# Patient Record
Sex: Female | Born: 1975
Health system: Southern US, Community
[De-identification: ages and names within clinical notes are randomized; demographics above are authoritative.]

## PROBLEM LIST (undated history)

## (undated) DIAGNOSIS — I10 Essential (primary) hypertension: Secondary | ICD-10-CM

## (undated) DIAGNOSIS — G43909 Migraine, unspecified, not intractable, without status migrainosus: Secondary | ICD-10-CM

## (undated) HISTORY — PX: TUBAL LIGATION: SHX77

---

## 2017-01-17 DIAGNOSIS — G43111 Migraine with aura, intractable, with status migrainosus: Secondary | ICD-10-CM | POA: Insufficient documentation

## 2017-08-07 DIAGNOSIS — R42 Dizziness and giddiness: Secondary | ICD-10-CM | POA: Insufficient documentation

## 2019-06-07 ENCOUNTER — Encounter (HOSPITAL_COMMUNITY): Payer: Self-pay | Admitting: Emergency Medicine

## 2019-06-07 ENCOUNTER — Inpatient Hospital Stay (HOSPITAL_COMMUNITY)
Admission: EM | Admit: 2019-06-07 | Discharge: 2019-06-11 | DRG: 690 | Disposition: A | Discharge status: Home / Self Care | Payer: Self-pay | Attending: Internal Medicine | Admitting: Internal Medicine

## 2019-06-07 DIAGNOSIS — Z20828 Contact with and (suspected) exposure to other viral communicable diseases: Secondary | ICD-10-CM

## 2019-06-07 DIAGNOSIS — Z8619 Personal history of other infectious and parasitic diseases: Secondary | ICD-10-CM

## 2019-06-07 DIAGNOSIS — Z8349 Family history of other endocrine, nutritional and metabolic diseases: Secondary | ICD-10-CM

## 2019-06-07 DIAGNOSIS — B690 Cysticercosis of central nervous system: Secondary | ICD-10-CM

## 2019-06-07 DIAGNOSIS — Z6841 Body Mass Index (BMI) 40.0 and over, adult: Secondary | ICD-10-CM

## 2019-06-07 DIAGNOSIS — N10 Acute tubulo-interstitial nephritis: Principal | ICD-10-CM | POA: Diagnosis present

## 2019-06-07 DIAGNOSIS — Z8249 Family history of ischemic heart disease and other diseases of the circulatory system: Secondary | ICD-10-CM

## 2019-06-07 DIAGNOSIS — N12 Tubulo-interstitial nephritis, not specified as acute or chronic: Secondary | ICD-10-CM | POA: Diagnosis present

## 2019-06-07 DIAGNOSIS — R7881 Bacteremia: Secondary | ICD-10-CM | POA: Diagnosis present

## 2019-06-07 DIAGNOSIS — B9620 Unspecified Escherichia coli [E. coli] as the cause of diseases classified elsewhere: Secondary | ICD-10-CM | POA: Diagnosis present

## 2019-06-07 DIAGNOSIS — N390 Urinary tract infection, site not specified: Secondary | ICD-10-CM

## 2019-06-07 DIAGNOSIS — D509 Iron deficiency anemia, unspecified: Secondary | ICD-10-CM

## 2019-06-07 DIAGNOSIS — R7989 Other specified abnormal findings of blood chemistry: Secondary | ICD-10-CM | POA: Diagnosis present

## 2019-06-07 DIAGNOSIS — Z833 Family history of diabetes mellitus: Secondary | ICD-10-CM

## 2019-06-07 DIAGNOSIS — I10 Essential (primary) hypertension: Secondary | ICD-10-CM | POA: Diagnosis present

## 2019-06-07 DIAGNOSIS — Z9851 Tubal ligation status: Secondary | ICD-10-CM

## 2019-06-07 DIAGNOSIS — R1031 Right lower quadrant pain: Secondary | ICD-10-CM

## 2019-06-07 DIAGNOSIS — R112 Nausea with vomiting, unspecified: Secondary | ICD-10-CM | POA: Diagnosis present

## 2019-06-07 DIAGNOSIS — R519 Headache, unspecified: Secondary | ICD-10-CM

## 2019-06-07 DIAGNOSIS — G43909 Migraine, unspecified, not intractable, without status migrainosus: Secondary | ICD-10-CM | POA: Diagnosis present

## 2019-06-07 HISTORY — DX: Migraine, unspecified, not intractable, without status migrainosus: G43.909

## 2019-06-07 HISTORY — DX: Essential (primary) hypertension: I10

## 2019-06-07 LAB — I-STAT BETA HCG BLOOD, ED (MC, WL, AP ONLY): I-stat hCG, quantitative: 32.3 m[IU]/mL — ABNORMAL HIGH (ref <5)

## 2019-06-07 LAB — URINALYSIS, ROUTINE W REFLEX MICROSCOPIC
Bacteria, UA: NONE SEEN
Bilirubin Urine: NEGATIVE
Glucose, UA: NEGATIVE mg/dL
Ketones, ur: NEGATIVE mg/dL
Nitrite: NEGATIVE
Protein, ur: 100 mg/dL — AB
Specific Gravity, Urine: 1.023 (ref 1.005–1.030)
WBC, UA: >50 WBC/hpf — ABNORMAL HIGH (ref 0–5)
pH: 5 (ref 5.0–8.0)

## 2019-06-07 LAB — COMPREHENSIVE METABOLIC PANEL
ALT: 104 U/L — ABNORMAL HIGH (ref 0–44)
AST: 85 U/L — ABNORMAL HIGH (ref 15–41)
Albumin: 3.9 g/dL (ref 3.5–5.0)
Alkaline Phosphatase: 129 U/L — ABNORMAL HIGH (ref 38–126)
Anion gap: 15 (ref 5–15)
BUN: 7 mg/dL (ref 6–20)
CO2: 17 mmol/L — ABNORMAL LOW (ref 22–32)
Calcium: 9.1 mg/dL (ref 8.9–10.3)
Chloride: 106 mmol/L (ref 98–111)
Creatinine, Ser: 1.19 mg/dL — ABNORMAL HIGH (ref 0.44–1.00)
GFR calc Af Amer: >60 mL/min (ref >60)
GFR calc non Af Amer: 56 mL/min — ABNORMAL LOW (ref >60)
Glucose, Bld: 132 mg/dL — ABNORMAL HIGH (ref 70–99)
Potassium: 3.7 mmol/L (ref 3.5–5.1)
Sodium: 138 mmol/L (ref 135–145)
Total Bilirubin: 1.5 mg/dL — ABNORMAL HIGH (ref 0.3–1.2)
Total Protein: 7.5 g/dL (ref 6.5–8.1)

## 2019-06-07 LAB — CBC
HCT: 38.7 % (ref 36.0–46.0)
Hemoglobin: 12.3 g/dL (ref 12.0–15.0)
MCH: 24.4 pg — ABNORMAL LOW (ref 26.0–34.0)
MCHC: 31.8 g/dL (ref 30.0–36.0)
MCV: 76.6 fL — ABNORMAL LOW (ref 80.0–100.0)
Platelets: 212 10*3/uL (ref 150–400)
RBC: 5.05 MIL/uL (ref 3.87–5.11)
RDW: 17.4 % — ABNORMAL HIGH (ref 11.5–15.5)
WBC: 24.8 10*3/uL — ABNORMAL HIGH (ref 4.0–10.5)
nRBC: 0 % (ref 0.0–0.2)

## 2019-06-07 LAB — LACTIC ACID, PLASMA: Lactic Acid, Venous: 1.6 mmol/L (ref 0.5–1.9)

## 2019-06-07 LAB — LIPASE, BLOOD: Lipase: 24 U/L (ref 11–51)

## 2019-06-07 MED ORDER — HYDROMORPHONE HCL 1 MG/ML IJ SOLN
1.0000 mg | Freq: Once | INTRAMUSCULAR | Status: AC
  Administered 2019-06-07: 1 mg via INTRAVENOUS
  Filled 2019-06-07: qty 1

## 2019-06-07 MED ORDER — KETOROLAC TROMETHAMINE 30 MG/ML IJ SOLN
30.0000 mg | Freq: Once | INTRAMUSCULAR | Status: AC
  Administered 2019-06-07: 23:00:00 30 mg via INTRAVENOUS
  Filled 2019-06-07: qty 1

## 2019-06-07 MED ORDER — SODIUM CHLORIDE 0.9% FLUSH: 3.0000 mL | Freq: Once | INTRAVENOUS | Status: DC

## 2019-06-07 MED ORDER — SODIUM CHLORIDE 0.9 % IV BOLUS
30.0000 mL/kg | Freq: Once | INTRAVENOUS | Status: AC
  Administered 2019-06-07: 23:00:00 3675 mL via INTRAVENOUS

## 2019-06-07 MED ORDER — SODIUM CHLORIDE 0.9 % IV SOLN
1.0000 g | Freq: Once | INTRAVENOUS | Status: AC
  Administered 2019-06-07: 1 g via INTRAVENOUS
  Filled 2019-06-07: qty 10

## 2019-06-07 MED ORDER — ACETAMINOPHEN 500 MG PO TABS
1000.0000 mg | ORAL_TABLET | Freq: Once | ORAL | Status: AC
  Administered 2019-06-07: 23:00:00 1000 mg via ORAL
  Filled 2019-06-07: qty 2

## 2019-06-07 NOTE — ED Triage Notes (Signed)
Patient reports migraine headache and low abdominal pain radiating to lower back onset this week , denies fever or chills / no emesis or diarrhea .

## 2019-06-07 NOTE — ED Provider Notes (Signed)
[facility] EMERGENCY [facility]PART[facility] Provider Note   CSN: [id number] Arrival date & time: 06/07/19  2025    History   Chief Complaint Chief Complaint  Patient presents with  . Migraine  . Abdominal Pain    HPI Connie Rush is a 43 y.o. female.     HPI Patient reports he started getting some lower abdominal pain about 2 days ago.  Ports that aching a lot in her lower abdomen and around her right side into her right back.  Reports she has been having some pain and burning with urination.  Ports she has had a kidney infection about 5 years ago.  She denies abnormal vaginal bleeding or discharge.  She reports she has had her tubes tied.  Ports she has had a fever up to 101 today.  She reports that today she is also developed a generalized headache and has vomited about 5 times. Past Medical History:  Diagnosis Date  . Hypertension     There are no active problems to display for this patient.   History reviewed. No pertinent surgical history.   OB History   No obstetric history on file.      Home Medications    Prior to Admission medications   Not on File    Family History No family history on file.  Social History Social History   Tobacco Use  . Smoking status: Never Smoker  . Smokeless tobacco: Never Used  Substance Use Topics  . Alcohol use: Never    Frequency: Never  . Drug use: Never     Allergies   Patient has no known allergies.   Review of Systems Review of Systems 10 Systems reviewed and are negative for acute change except as noted in the HPI.   Physical Exam Updated Vital Signs BP (!) 133/100   Pulse 92   Temp 99.1 F (37.3 C) (Oral)   Resp 18   Wt 122.5 kg   SpO2 100%   Physical Exam Constitutional:      Comments: Patient is alert.  No respiratory distress.  She appears uncomfortable and ill.  Morbid obesity.  HENT:     Mouth/Throat:     Mouth: Mucous membranes are moist.     Pharynx: Oropharynx is clear.   Eyes:     Extraocular Movements: Extraocular movements intact.     Conjunctiva/sclera: Conjunctivae normal.  Neck:     Musculoskeletal: Neck supple.  Cardiovascular:     Rate and Rhythm: Normal rate and regular rhythm.  Pulmonary:     Effort: Pulmonary effort is normal.     Breath sounds: Normal breath sounds.  Abdominal:     Comments: Abdomen tender in the right lower quadrant and right flank to percussion.  No guarding.  Musculoskeletal: Normal range of motion.        General: No swelling or tenderness.     Right lower leg: No edema.     Left lower leg: No edema.  Skin:    General: Skin is warm and dry.  Neurological:     General: No focal deficit present.     Mental Status: She is oriented to person, place, and time.     Coordination: Coordination normal.      ED Treatments / Results  Labs (all labs ordered are listed, but only abnormal results are displayed) Labs Reviewed  COMPREHENSIVE METABOLIC PANEL - Abnormal; Notable for the following components:      Result Value   CO2 17 (*)  Glucose, Bld 132 (*)    Creatinine, Ser 1.19 (*)    AST 85 (*)    ALT 104 (*)    Alkaline Phosphatase 129 (*)    Total Bilirubin 1.5 (*)    GFR calc non Af Amer 56 (*)    All other components within normal limits  CBC - Abnormal; Notable for the following components:   WBC 24.8 (*)    MCV 76.6 (*)    MCH 24.4 (*)    RDW 17.4 (*)    All other components within normal limits  URINALYSIS, ROUTINE W REFLEX MICROSCOPIC - Abnormal; Notable for the following components:   Color, Urine AMBER (*)    APPearance CLOUDY (*)    Hgb urine dipstick MODERATE (*)    Protein, ur 100 (*)    Leukocytes,Ua LARGE (*)    WBC, UA >50 (*)    Non Squamous Epithelial 0-5 (*)    All other components within normal limits  I-STAT BETA HCG BLOOD, ED (MC, [facility], AP ONLY) - Abnormal; Notable for the following components:   I-stat hCG, quantitative 32.3 (*)    All other components within normal limits   CULTURE, BLOOD (ROUTINE X 2)  CULTURE, BLOOD (ROUTINE X 2)  URINE CULTURE  WET PREP, GENITAL  LIPASE, BLOOD  LACTIC ACID, PLASMA  LACTIC ACID, PLASMA  RPR  HIV ANTIBODY (ROUTINE TESTING W REFLEX)  GC/CHLAMYDIA PROBE AMP () NOT AT [facility]    EKG None  Radiology No results found.  Procedures Procedures (including critical care time)  Medications Ordered in ED Medications  sodium chloride flush (NS) 0.9 % injection 3 mL (has no administration in time range)  sodium chloride 0.9 % bolus 3,675 mL (has no administration in time range)  cefTRIAXone (ROCEPHIN) 1 g in sodium chloride 0.9 % 100 mL IVPB (has no administration in time range)  ketorolac (TORADOL) 30 MG/ML injection 30 mg (has no administration in time range)  HYDROmorphone (DILAUDID) injection 1 mg (has no administration in time range)  acetaminophen (TYLENOL) tablet 1,000 mg (has no administration in time range)     Initial Impression / Assessment and Plan / ED Course  I have reviewed the triage vital signs and the nursing notes.  Pertinent labs & imaging results that were available during my care of the patient were reviewed by me and considered in my medical decision making (see chart for details).        Patient has had approximately 2 days of lower abdominal pain with flank pain, nausea vomiting fever and dysuria.  Urine is grossly positive.  Will start Rocephin and fluids as well as Toradol and Dilaudid for pain.  Patient will need CT abdomen to rule out possible retained stone or appendicitis.  Patient's quant is very mildly positive.  Significantly doubt pregnancy patient has tubal ligation.  Will need to proceed with CT scan, to be determined if pelvic ultrasound needed as well.  Final Clinical Impressions(s) / ED Diagnoses   Final diagnoses:  Right lower quadrant abdominal pain  Lower urinary tract infectious disease    ED Discharge Orders    None       Charlesetta Shanks, MD 06/14/19 1300

## 2019-06-08 ENCOUNTER — Emergency Department (HOSPITAL_COMMUNITY): Payer: Self-pay

## 2019-06-08 ENCOUNTER — Encounter (HOSPITAL_COMMUNITY): Payer: Self-pay | Admitting: Internal Medicine

## 2019-06-08 DIAGNOSIS — N12 Tubulo-interstitial nephritis, not specified as acute or chronic: Secondary | ICD-10-CM | POA: Diagnosis present

## 2019-06-08 DIAGNOSIS — N390 Urinary tract infection, site not specified: Secondary | ICD-10-CM

## 2019-06-08 DIAGNOSIS — R799 Abnormal finding of blood chemistry, unspecified: Secondary | ICD-10-CM

## 2019-06-08 DIAGNOSIS — R1031 Right lower quadrant pain: Secondary | ICD-10-CM

## 2019-06-08 LAB — CBC
HCT: 32.9 % — ABNORMAL LOW (ref 36.0–46.0)
Hemoglobin: 10.5 g/dL — ABNORMAL LOW (ref 12.0–15.0)
MCH: 24.6 pg — ABNORMAL LOW (ref 26.0–34.0)
MCHC: 31.9 g/dL (ref 30.0–36.0)
MCV: 77.2 fL — ABNORMAL LOW (ref 80.0–100.0)
Platelets: 159 10*3/uL (ref 150–400)
RBC: 4.26 MIL/uL (ref 3.87–5.11)
RDW: 17.7 % — ABNORMAL HIGH (ref 11.5–15.5)
WBC: 24.4 10*3/uL — ABNORMAL HIGH (ref 4.0–10.5)
nRBC: 0 % (ref 0.0–0.2)

## 2019-06-08 LAB — BLOOD CULTURE ID PANEL (REFLEXED)

## 2019-06-08 LAB — HEPATIC FUNCTION PANEL
ALT: 78 U/L — ABNORMAL HIGH (ref 0–44)
AST: 56 U/L — ABNORMAL HIGH (ref 15–41)
Albumin: 3.1 g/dL — ABNORMAL LOW (ref 3.5–5.0)
Alkaline Phosphatase: 107 U/L (ref 38–126)
Bilirubin, Direct: 0.4 mg/dL — ABNORMAL HIGH (ref 0.0–0.2)
Indirect Bilirubin: 0.2 mg/dL — ABNORMAL LOW (ref 0.3–0.9)
Total Bilirubin: 0.6 mg/dL (ref 0.3–1.2)
Total Protein: 6.3 g/dL — ABNORMAL LOW (ref 6.5–8.1)

## 2019-06-08 LAB — BASIC METABOLIC PANEL
Anion gap: 10 (ref 5–15)
BUN: 6 mg/dL (ref 6–20)
CO2: 19 mmol/L — ABNORMAL LOW (ref 22–32)
Calcium: 8.3 mg/dL — ABNORMAL LOW (ref 8.9–10.3)
Chloride: 109 mmol/L (ref 98–111)
Creatinine, Ser: 1.05 mg/dL — ABNORMAL HIGH (ref 0.44–1.00)
GFR calc Af Amer: >60 mL/min (ref >60)
GFR calc non Af Amer: >60 mL/min (ref >60)
Glucose, Bld: 122 mg/dL — ABNORMAL HIGH (ref 70–99)
Potassium: 3.6 mmol/L (ref 3.5–5.1)
Sodium: 138 mmol/L (ref 135–145)

## 2019-06-08 LAB — HIV ANTIBODY (ROUTINE TESTING W REFLEX): HIV Screen 4th Generation wRfx: NONREACTIVE

## 2019-06-08 LAB — NOVEL CORONAVIRUS, NAA (HOSP ORDER, SEND-OUT TO REF LAB; TAT 18-24 HRS): SARS-CoV-2, NAA: NOT DETECTED

## 2019-06-08 LAB — LACTIC ACID, PLASMA: Lactic Acid, Venous: 1 mmol/L (ref 0.5–1.9)

## 2019-06-08 LAB — POC URINE PREG, ED: Preg Test, Ur: NEGATIVE

## 2019-06-08 LAB — HCG, QUANTITATIVE, PREGNANCY: hCG, Beta Chain, Quant, S: <1 m[IU]/mL (ref <5)

## 2019-06-08 LAB — RPR: RPR Ser Ql: NONREACTIVE

## 2019-06-08 MED ORDER — PROMETHAZINE HCL 12.5 MG RE SUPP
12.5000 mg | Freq: Four times a day (QID) | RECTAL | Status: DC | PRN
  Filled 2019-06-08: qty 2

## 2019-06-08 MED ORDER — ACETAMINOPHEN 650 MG RE SUPP: 650.0000 mg | Freq: Four times a day (QID) | RECTAL | Status: DC | PRN

## 2019-06-08 MED ORDER — ONDANSETRON HCL 4 MG PO TABS: 4.0000 mg | ORAL_TABLET | Freq: Four times a day (QID) | ORAL | Status: DC | PRN

## 2019-06-08 MED ORDER — IOHEXOL 300 MG/ML  SOLN
100.0000 mL | Freq: Once | INTRAMUSCULAR | Status: AC | PRN
  Administered 2019-06-08: 100 mL via INTRAVENOUS

## 2019-06-08 MED ORDER — ENOXAPARIN SODIUM 40 MG/0.4ML ~~LOC~~ SOLN
40.0000 mg | SUBCUTANEOUS | Status: DC
  Administered 2019-06-08: 40 mg via SUBCUTANEOUS
  Filled 2019-06-08 (×2): qty 0.4

## 2019-06-08 MED ORDER — SODIUM CHLORIDE 0.9 % IV SOLN
1.0000 g | Freq: Three times a day (TID) | INTRAVENOUS | Status: DC
  Administered 2019-06-08 – 2019-06-09 (×5): 1 g via INTRAVENOUS
  Filled 2019-06-08 (×8): qty 1

## 2019-06-08 MED ORDER — SODIUM CHLORIDE 0.9 % IV SOLN
2.0000 g | INTRAVENOUS | Status: DC
  Filled 2019-06-08: qty 20

## 2019-06-08 MED ORDER — ONDANSETRON HCL 4 MG/2ML IJ SOLN
4.0000 mg | Freq: Once | INTRAMUSCULAR | Status: AC
  Administered 2019-06-08: 4 mg via INTRAVENOUS
  Filled 2019-06-08: qty 2

## 2019-06-08 MED ORDER — SODIUM CHLORIDE 0.9 % IV SOLN
1.0000 g | INTRAVENOUS | Status: DC
  Filled 2019-06-08: qty 10

## 2019-06-08 MED ORDER — KETOROLAC TROMETHAMINE 30 MG/ML IJ SOLN
30.0000 mg | Freq: Four times a day (QID) | INTRAMUSCULAR | Status: DC | PRN
  Administered 2019-06-08 – 2019-06-10 (×7): 30 mg via INTRAVENOUS
  Filled 2019-06-08 (×7): qty 1

## 2019-06-08 MED ORDER — ONDANSETRON HCL 4 MG/2ML IJ SOLN
4.0000 mg | Freq: Four times a day (QID) | INTRAMUSCULAR | Status: DC | PRN
  Administered 2019-06-08 (×2): 4 mg via INTRAVENOUS
  Filled 2019-06-08 (×2): qty 2

## 2019-06-08 MED ORDER — HYDROMORPHONE HCL 1 MG/ML IJ SOLN
0.5000 mg | INTRAMUSCULAR | Status: DC | PRN
  Administered 2019-06-08 – 2019-06-11 (×15): 1 mg via INTRAVENOUS
  Filled 2019-06-08 (×15): qty 1

## 2019-06-08 MED ORDER — SODIUM CHLORIDE 0.9 % IV SOLN: INTRAVENOUS | Status: DC

## 2019-06-08 MED ORDER — SODIUM CHLORIDE 0.9 % IV SOLN
INTRAVENOUS | Status: DC
  Administered 2019-06-08 – 2019-06-10 (×4): via INTRAVENOUS

## 2019-06-08 MED ORDER — PROMETHAZINE HCL 25 MG/ML IJ SOLN
12.5000 mg | Freq: Four times a day (QID) | INTRAMUSCULAR | Status: DC | PRN
  Administered 2019-06-08: 25 mg via INTRAVENOUS
  Filled 2019-06-08: qty 1

## 2019-06-08 MED ORDER — PROMETHAZINE HCL 25 MG PO TABS: 12.5000 mg | ORAL_TABLET | Freq: Four times a day (QID) | ORAL | Status: DC | PRN

## 2019-06-08 MED ORDER — ACETAMINOPHEN 325 MG PO TABS: 650.0000 mg | ORAL_TABLET | Freq: Four times a day (QID) | ORAL | Status: DC | PRN

## 2019-06-08 NOTE — ED Notes (Signed)
Dr. Garner in to assess pt for admission 

## 2019-06-08 NOTE — ED Provider Notes (Signed)
Patient signed out to me by Dr. Vallery Ridge to follow-up CT scan.  Patient seen for several days of lower abdominal pain, radiating into the back.  Patient has a significant leukocytosis and obvious urinary tract infection on initial work-up.  CT scan has now been performed and shows evidence of right pyelonephritis, no other evidence of intra-abdominal or pelvic pathology.  Patient actively vomiting upon recheck.  Will admit for further management.   Orpah Greek, MD 06/08/19 [phone]

## 2019-06-08 NOTE — Progress Notes (Signed)
Patients daughter Monia Pouch called and wanted to make sure that we were using an interpreter for the patient. I notified Wyvonnia Lora, RN.

## 2019-06-08 NOTE — Progress Notes (Signed)
43 yo F presenting with NV. CT ab/pelvis c/w R pyelonephritis. UCx pending. On rocephin. Continue as per H&P. COVID testing pending. Low suspicion for COVID.  Pyelonephritis     - Rocephin     - UCx and BCx pending     - zofran PRN, Phenergan PRN refractory N/V     - toradol PRN, Dilaudid PRN refractory pain  Mildly Elevated serum b-HCG - 32.3     - Urine pregnancy test neg however     - History of tubal ligation     - No evidence of ectopic or other abnormality that could cause this detected on CT scan tonight     - Will get a repeat serum B-HCG with AM labs this morning and see if its real (vs a false positive on initial serum test) and trending anywhere.  Elevated LFTs     - repeat LFTs; check hepatitis panel if it remains elevated  General: 43 y.o. female resting in bed in NAD Cardiovascular: RRR, +S1, S2, no m/g/r, equal pulses throughout Respiratory: CTABL, no w/r/r, normal WOB GI: BS+, NDNT, no masses noted, no organomegaly noted MSK: No e/c/c, R CVAT Skin: No rashes, bruises, ulcerations noted Neuro: A&O x 3, no focal deficits  .Taji Sather A Marylyn Ishihara DO

## 2019-06-08 NOTE — Progress Notes (Signed)
RN did not know pt was not swabbed for Covid. Order was placed after calling MD and [facility] came to do swab. Pt was placed on precautions and results were walked down to the lab. Awaiting results.   Eleanora Neighbor, RN

## 2019-06-08 NOTE — Progress Notes (Signed)
Attempted to call spouse, Rey to update on pt condition, no answer, mailbox had not been set up.

## 2019-06-08 NOTE — Progress Notes (Signed)
Patient's daughter came by the hospital to drop items off for her mom. She got an update on how her mom was doing. She wanted to talk to the MD. MD was notified. [place] K Natassia Guthridge

## 2019-06-08 NOTE — H&P (Signed)
History and Physical    Connie Rush [id number] DOB: April 11, 1976 DOA: 06/07/2019  PCP: Patient, No Pcp Per  Patient coming from: Home  I have personally briefly reviewed patient's old [facility]  Chief Complaint: N/V, abd pain, headache  HPI: Connie HuaYulma Mainer is a 43 y.o. female with medical history significant of HTN, migraines.  Patient presents to the ED with worsening RLQ abd pain, N/V, headache.  Symptoms onset 2 days ago.  Pain radiates around into her lower back.  Symptoms worsening.  Feels like a kidney infection she had about 5 years ago.  Tm 101 at home today.   ED Course: UA looks infected, and CT abd/pelvis confirms R sided pyelonephritis.  b-HCG is 32.3 on the blood test oddly enough, though urine pregnancy test is negative, and she has a history of tubal ligation.  No other abnormality other than the pyelonephritis noted on CT.  Rocephin started, N/V persistent.  Hospitalist asked to admit.   Review of Systems: As per HPI otherwise 10 point review of systems negative.   Past Medical History:  Diagnosis Date   Hypertension    Migraine     Past Surgical History:  Procedure Laterality Date   TUBAL LIGATION       reports that she has never smoked. She has never used smokeless tobacco. She reports that she does not drink alcohol or use drugs.  No Known Allergies  Family History  Problem Relation Age of Onset   Diabetes Father    Hyperlipidemia Father    Hypertension Father    CAD Maternal Grandmother    Stroke Neg Hx    Breast cancer Neg Hx      Prior to Admission medications   Not on File    Physical Exam: Vitals:   06/07/19 2315 06/07/19 2330 06/08/19 0015 06/08/19 0030  BP: (!) 141/96 (!) 148/94 127/72 128/76  Pulse: 77 88 83 85  Resp:      Temp:      TempSrc:      SpO2: 96% 95% 95% 98%  Weight:        Constitutional: NAD, calm, comfortable Eyes: PERRL, lids and conjunctivae normal ENMT: Mucous membranes  are moist. Posterior pharynx clear of any exudate or lesions.Normal dentition.  Neck: normal, supple, no masses, no thyromegaly Respiratory: clear to auscultation bilaterally, no wheezing, no crackles. Normal respiratory effort. No accessory muscle use.  Cardiovascular: Regular rate and rhythm, no murmurs / rubs / gallops. No extremity edema. 2+ pedal pulses. No carotid bruits.  Abdomen: R flank TTP Musculoskeletal: no clubbing / cyanosis. No joint deformity upper and lower extremities. Good ROM, no contractures. Normal muscle tone.  Skin: no rashes, lesions, ulcers. No induration Neurologic: CN 2-12 grossly intact. Sensation intact, DTR normal. Strength 5/5 in all 4.  Psychiatric: Normal judgment and insight. Alert and oriented x 3. Normal mood.    Labs on Admission: I have personally reviewed following labs and imaging studies  CBC: Recent Labs  Lab 06/07/19 2044  WBC 24.8*  HGB 12.3  HCT 38.7  MCV 76.6*  PLT 212   Basic Metabolic Panel: Recent Labs  Lab 06/07/19 2044  NA 138  K 3.7  CL 106  CO2 17*  GLUCOSE 132*  BUN 7  CREATININE 1.19*  CALCIUM 9.1   GFR: CrCl cannot be calculated (Unknown ideal weight.). Liver Function Tests: Recent Labs  Lab 06/07/19 2044  AST 85*  ALT 104*  ALKPHOS 129*  BILITOT 1.5*  PROT  7.5  ALBUMIN 3.9   Recent Labs  Lab 06/07/19 2044  LIPASE 24   No results for input(s): AMMONIA in the last 168 hours. Coagulation Profile: No results for input(s): INR, PROTIME in the last 168 hours. Cardiac Enzymes: No results for input(s): CKTOTAL, CKMB, CKMBINDEX, TROPONINI in the last 168 hours. BNP (last 3 results) No results for input(s): PROBNP in the last 8760 hours. HbA1C: No results for input(s): HGBA1C in the last 72 hours. CBG: No results for input(s): GLUCAP in the last 168 hours. Lipid Profile: No results for input(s): CHOL, HDL, LDLCALC, TRIG, CHOLHDL, LDLDIRECT in the last 72 hours. Thyroid Function Tests: No results for  input(s): TSH, T4TOTAL, FREET4, T3FREE, THYROIDAB in the last 72 hours. Anemia Panel: No results for input(s): VITAMINB12, FOLATE, FERRITIN, TIBC, IRON, RETICCTPCT in the last 72 hours. Urine analysis:    Component Value Date/Time   COLORURINE AMBER (A) 06/07/2019 2120   APPEARANCEUR CLOUDY (A) 06/07/2019 2120   LABSPEC 1.023 06/07/2019 2120   PHURINE 5.0 06/07/2019 2120   GLUCOSEU NEGATIVE 06/07/2019 2120   HGBUR MODERATE (A) 06/07/2019 2120   BILIRUBINUR NEGATIVE 06/07/2019 2120   KETONESUR NEGATIVE 06/07/2019 2120   PROTEINUR 100 (A) 06/07/2019 2120   NITRITE NEGATIVE 06/07/2019 2120   LEUKOCYTESUR LARGE (A) 06/07/2019 2120    Radiological Exams on Admission: Ct Abdomen Pelvis W Contrast  Result Date: 06/08/2019 CLINICAL DATA:  Abdominal pain. EXAM: CT ABDOMEN AND PELVIS WITH CONTRAST TECHNIQUE: Multidetector CT imaging of the abdomen and pelvis was performed using the standard protocol following bolus administration of intravenous contrast. CONTRAST:  100mL OMNIPAQUE IOHEXOL 300 MG/ML  SOLN COMPARISON:  None. FINDINGS: Lower chest: No acute abnormality. Hepatobiliary: No focal liver abnormality is seen. No gallstones, gallbladder wall thickening, or biliary dilatation. Pancreas: Unremarkable. No pancreatic ductal dilatation or surrounding inflammatory changes. Spleen: Normal in size without focal abnormality. Adrenals/Urinary Tract: There is fat stranding about the right kidney with mild right-sided collecting system dilatation. There is diffuse enhancement of the right ureter. There is no evidence of a radiopaque obstructing kidney stone. The left kidney is unremarkable with no evidence of a radiopaque obstructing stone. The bladder is unremarkable. Stomach/Bowel: There are scattered colonic diverticula without CT evidence of diverticulitis. The appendix is unremarkable. The stomach is unremarkable. There is no evidence of a small-bowel obstruction. Vascular/Lymphatic: Aortic  atherosclerosis. No enlarged abdominal or pelvic lymph nodes. Reproductive: There is a small amount of fluid in the lower uterine segment. There is an right ovary cystic structure measuring approximately 3.2 cm. Other: No abdominal wall hernia or abnormality. No abdominopelvic ascites. Musculoskeletal: No acute or significant osseous findings. IMPRESSION: Overall findings consistent with right-sided pyelonephritis. Electronically Signed   By: Katherine Mantlehristopher  Green M.D.   On: 06/08/2019 01:24    EKG: Independently reviewed.  Assessment/Plan Principal Problem:   Pyelonephritis    1. Pyelonephritis - 1. Rocephin 2. UCx and BCx pending 3. IVF: got 5730ml/kg (3.6L) bolus in ED, will hold off on further fluids for the moment as she isnt tachycardic, lactate nl.  Start maint fluids later today if she has persistent N/V 4. zofran PRN n/v 5. Phenergan PRN refractory N/V 6. toradol PRN pain 7. Dilaudid PRN refractory pain 2. Mildly Elevated serum b-HCG - 32.3 1. Urine pregnancy test neg however 2. History of tubal ligation 3. No evidence of ectopic or other abnormality that could cause this detected on CT scan tonight 4. Will get a repeat serum B-HCG with AM labs this morning and see if  its real (vs a false positive on initial serum test) and trending anywhere.  DVT prophylaxis: Lovenox Code Status: Full Family Communication: No family in room Disposition Plan: Home after admit Consults called: None Admission status: Place in 25   Delmus Warwick, [place] [facility]  How to contact the [facility] Attending or Consulting provider [place] or covering provider during after hours [place], for this patient?  1. Check the care team in [facility] and look for a) attending/consulting [facility] provider listed and b) the [facility] team listed 2. Log into [email or web address]  Amion Physician Scheduling and messaging for groups and whole hospitals  On call and physician scheduling software for group practices, residents,  hospitalists and other medical providers for call, clinic, rotation and shift schedules. OnCall [employer] is a hospital-wide system for scheduling doctors and paging doctors on call. EasyPlot is for scientific plotting and data analysis.  [email or web address]  and use [place]'s universal password to access. If you do not have the password, please contact the hospital operator.  3. Locate the [facility] provider you are looking for under [facility] and page to a number that you can be directly reached. 4. If you still have difficulty reaching the provider, please page the [facility] (Director on Call) for the Hospitalists listed on amion for assistance.  06/08/2019, 2:53 AM

## 2019-06-08 NOTE — Progress Notes (Signed)
Pt was on the phone with her husband when she told this RN that she has a brain cyst that was diagnosed 2 years ago and she wonders if that is why her eyes are hurting with her migraines. Pt stated MD is in the process of retrieving her medical records, but she forgot to mention this to MD. This RN messaged NP on call to make them aware. Pt stated that she was seeing someone for it, but the medication to shrink it is so expensive that she was waiting to get on some assistance program the MD recommended in order to get the medication. Pt has not had any of the medicine yet for it, per pt.   Eleanora Neighbor, RN

## 2019-06-08 NOTE — Progress Notes (Signed)
PHARMACY - PHYSICIAN COMMUNICATION CRITICAL VALUE ALERT - BLOOD CULTURE IDENTIFICATION (BCID)  Connie Rush is an 43 y.o. female who presented to [facility] on [facility]/07/2019 with a chief complaint of RLQ abd pain  Assessment: 18 YOF on antibiotics for R-sided pyelonephritis now with 4/4 blood cultures growing GNR with BCID detecting E. Coli. The patient does have a noted history of an ESBL E.coli UTI back in 2016. Will broaden to Meropenem until sensitivities are back and then can consider narrowing at that time.   Name of physician (or Provider) Contacted: Marylyn Ishihara  Current antibiotics: Rocephin  Changes to prescribed antibiotics recommended:  D/c Rocephin and transition to Meropenem 1g IV every 8 hours while awaiting sensitivities given history of prior ESBL  Results for orders placed or performed during the hospital encounter of 06/07/19  Blood Culture ID Panel (Reflexed) (Collected: 06/07/2019 10:45 PM)  Result Value Ref Range   Enterococcus species NOT DETECTED NOT DETECTED   Listeria monocytogenes NOT DETECTED NOT DETECTED   Staphylococcus species NOT DETECTED NOT DETECTED   Staphylococcus aureus (BCID) NOT DETECTED NOT DETECTED   Streptococcus species NOT DETECTED NOT DETECTED   Streptococcus agalactiae NOT DETECTED NOT DETECTED   Streptococcus pneumoniae NOT DETECTED NOT DETECTED   Streptococcus pyogenes NOT DETECTED NOT DETECTED   Acinetobacter baumannii NOT DETECTED NOT DETECTED   Enterobacteriaceae species DETECTED (A) NOT DETECTED   Enterobacter cloacae complex NOT DETECTED NOT DETECTED   Escherichia coli DETECTED (A) NOT DETECTED   Klebsiella oxytoca NOT DETECTED NOT DETECTED   Klebsiella pneumoniae NOT DETECTED NOT DETECTED   Proteus species NOT DETECTED NOT DETECTED   Serratia marcescens NOT DETECTED NOT DETECTED   Carbapenem resistance NOT DETECTED NOT DETECTED   Haemophilus influenzae NOT DETECTED NOT DETECTED   Neisseria meningitidis NOT DETECTED NOT DETECTED   Pseudomonas aeruginosa NOT DETECTED NOT DETECTED   Candida albicans NOT DETECTED NOT DETECTED   Candida glabrata NOT DETECTED NOT DETECTED   Candida krusei NOT DETECTED NOT DETECTED   Candida parapsilosis NOT DETECTED NOT DETECTED   Candida tropicalis NOT DETECTED NOT DETECTED    Thank you for allowing pharmacy to be a part of this patient's care.  Alycia Rossetti, PharmD, BCPS Clinical Pharmacist Clinical phone for 06/08/2019: [phone] 06/08/2019 12:12 PM   **Pharmacist phone directory can now be found on [email or web address] (PW [email or web address]).  Listed under [place].

## 2019-06-09 ENCOUNTER — Encounter (HOSPITAL_COMMUNITY): Payer: Self-pay | Admitting: *Deleted

## 2019-06-09 ENCOUNTER — Other Ambulatory Visit: Payer: Self-pay

## 2019-06-09 DIAGNOSIS — B690 Cysticercosis of central nervous system: Secondary | ICD-10-CM

## 2019-06-09 DIAGNOSIS — R519 Headache, unspecified: Secondary | ICD-10-CM

## 2019-06-09 DIAGNOSIS — D509 Iron deficiency anemia, unspecified: Secondary | ICD-10-CM

## 2019-06-09 LAB — CBC WITH DIFFERENTIAL/PLATELET
Abs Immature Granulocytes: 0.12 10*3/uL — ABNORMAL HIGH (ref 0.00–0.07)
Basophils Absolute: 0.1 10*3/uL (ref 0.0–0.1)
Basophils Relative: 0 %
Eosinophils Absolute: 0.1 10*3/uL (ref 0.0–0.5)
Eosinophils Relative: 1 %
HCT: 30.3 % — ABNORMAL LOW (ref 36.0–46.0)
Hemoglobin: 9.5 g/dL — ABNORMAL LOW (ref 12.0–15.0)
Immature Granulocytes: 1 %
Lymphocytes Relative: 10 %
Lymphs Abs: 2 10*3/uL (ref 0.7–4.0)
MCH: 24.3 pg — ABNORMAL LOW (ref 26.0–34.0)
MCHC: 31.4 g/dL (ref 30.0–36.0)
MCV: 77.5 fL — ABNORMAL LOW (ref 80.0–100.0)
Monocytes Absolute: 2.1 10*3/uL — ABNORMAL HIGH (ref 0.1–1.0)
Monocytes Relative: 11 %
Neutro Abs: 14.7 10*3/uL — ABNORMAL HIGH (ref 1.7–7.7)
Neutrophils Relative %: 77 %
Platelets: 140 10*3/uL — ABNORMAL LOW (ref 150–400)
RBC: 3.91 MIL/uL (ref 3.87–5.11)
RDW: 18.1 % — ABNORMAL HIGH (ref 11.5–15.5)
WBC: 19 10*3/uL — ABNORMAL HIGH (ref 4.0–10.5)
nRBC: 0 % (ref 0.0–0.2)

## 2019-06-09 LAB — COMPREHENSIVE METABOLIC PANEL
ALT: 50 U/L — ABNORMAL HIGH (ref 0–44)
AST: 28 U/L (ref 15–41)
Albumin: 3.1 g/dL — ABNORMAL LOW (ref 3.5–5.0)
Alkaline Phosphatase: 96 U/L (ref 38–126)
Anion gap: 7 (ref 5–15)
BUN: 7 mg/dL (ref 6–20)
CO2: 22 mmol/L (ref 22–32)
Calcium: 8.7 mg/dL — ABNORMAL LOW (ref 8.9–10.3)
Chloride: 107 mmol/L (ref 98–111)
Creatinine, Ser: 1.09 mg/dL — ABNORMAL HIGH (ref 0.44–1.00)
GFR calc Af Amer: >60 mL/min (ref >60)
GFR calc non Af Amer: >60 mL/min (ref >60)
Glucose, Bld: 101 mg/dL — ABNORMAL HIGH (ref 70–99)
Potassium: 3.5 mmol/L (ref 3.5–5.1)
Sodium: 136 mmol/L (ref 135–145)
Total Bilirubin: 0.5 mg/dL (ref 0.3–1.2)
Total Protein: 6.4 g/dL — ABNORMAL LOW (ref 6.5–8.1)

## 2019-06-09 LAB — URINE CULTURE: Culture: <10000 — AB

## 2019-06-09 LAB — IRON AND TIBC
Iron: 8 ug/dL — ABNORMAL LOW (ref 28–170)
Saturation Ratios: 2 % — ABNORMAL LOW (ref 10.4–31.8)
TIBC: 462 ug/dL — ABNORMAL HIGH (ref 250–450)
UIBC: 454 ug/dL

## 2019-06-09 LAB — MAGNESIUM: Magnesium: 1.8 mg/dL (ref 1.7–2.4)

## 2019-06-09 MED ORDER — DIPHENHYDRAMINE HCL 50 MG/ML IJ SOLN
25.0000 mg | Freq: Once | INTRAMUSCULAR | Status: AC
  Administered 2019-06-09: 25 mg via INTRAVENOUS
  Filled 2019-06-09: qty 1

## 2019-06-09 MED ORDER — METOCLOPRAMIDE HCL 5 MG/ML IJ SOLN
5.0000 mg | Freq: Once | INTRAMUSCULAR | Status: AC
  Administered 2019-06-09: 5 mg via INTRAVENOUS
  Filled 2019-06-09: qty 2

## 2019-06-09 MED ORDER — KETOROLAC TROMETHAMINE 15 MG/ML IJ SOLN
15.0000 mg | Freq: Once | INTRAMUSCULAR | Status: AC
  Administered 2019-06-09: 15 mg via INTRAVENOUS
  Filled 2019-06-09: qty 1

## 2019-06-09 NOTE — Progress Notes (Signed)
PROGRESS NOTE    Connie Rush  HYQ:[id number] DOB: 1976-08-31 DOA: 06/07/2019 PCP: Patient, No Pcp Per   Brief Narrative:   Connie Rush is a 43 y.o. female with medical history significant of HTN, migraines.  Patient presents to the ED with worsening RLQ abd pain, N/V, headache.  Symptoms onset 2 days ago.  Pain radiates around into her lower back.  Symptoms worsening.  Feels like a kidney infection she had about 5 years ago.  Tm 101 at home today.   Assessment & Plan:   Principal Problem:   Pyelonephritis   Pyelonephritis     - Rocephin     - UCx and BCx pending     - zofran PRN, Phenergan PRN refractory N/V     - toradol PRN, Dilaudid PRN refractory pain     - rocephin changed to merrem d/t history of ESBL     - white count improved, a febrile ON  Mildly Elevated serum b-HCG - 32.3     - Urine pregnancy test neg however     - History of tubal ligation     - No evidence of ectopic or other abnormality that could cause this detected on CT scan tonight     - Will get a repeat serum B-HCG with AM labs this morning and see if its real (vs a false positive on initial serum test) and trending anywhere.  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 merrem    Subjective: "I have a headache. I have cyst on brain."  Objective: Vitals:   06/08/19 1812 06/08/19 2034 06/09/19 0642 06/09/19 0945  BP: (!) 146/86 (!) 154/89 130/77 (!) 151/93  Pulse: 78 82 88 64  Resp: 18 20 20 18   Temp: 99.5 F (37.5 C) 98 F (36.7 C) 98.7 F (37.1 C) 99 F (37.2 C)  TempSrc: Oral Oral Oral Oral  SpO2: 97% 97% 92%  93%  Weight:   127.9 kg   Height:        Intake/Output Summary (Last 24 hours) at 06/09/2019 1219 Last data filed at 06/09/2019 1044 Gross per 24 hour  Intake 1585.15 ml  Output 800 ml  Net 785.15 ml   Filed Weights   06/07/19 2200 06/07/19 2205 06/09/19 0642  Weight: 122.5 kg 122.5 kg 127.9 kg    Examination:  General: 43 y.o. female resting in bed in NAD Cardiovascular: RRR, +S1, S2, no m/g/r, equal pulses throughout Respiratory: CTABL, no w/r/r, normal WOB GI: BS+, NDNT, no masses noted, no organomegaly noted MSK: No e/c/c, R CVAT Skin: No rashes, bruises, ulcerations noted Neuro: A&O x 3, no focal deficits    Data Reviewed: I have personally reviewed following labs and imaging studies.  CBC: Recent Labs  Lab 06/07/19 2044 06/08/19 0630 06/09/19 0921  WBC 24.8* 24.4* 19.0*  NEUTROABS  --   --  14.7*  HGB 12.3 10.5* 9.5*  HCT 38.7 32.9* 30.3*  MCV 76.6* 77.2* 77.5*  PLT 212 159 140*   Basic Metabolic Panel:  Recent Labs  Lab 06/07/19 2044 06/08/19 0630 06/09/19 0921  NA 138 138 136  K 3.7 3.6 3.5  CL 106 109 107  CO2 17* 19* 22  GLUCOSE 132* 122* 101*  BUN 7 6 7   CREATININE 1.19* 1.05* 1.09*  CALCIUM 9.1 8.3* 8.7*  MG  --   --  1.8   GFR: Estimated Creatinine Clearance: 92.6 mL/min (A) (by C-G formula based on SCr of 1.09 mg/dL (H)). Liver Function Tests: Recent Labs  Lab 06/07/19 2044 06/08/19 0644 06/09/19 0921  AST 85* 56* 28  ALT 104* 78* 50*  ALKPHOS 129* 107 96  BILITOT 1.5* 0.6 0.5  PROT 7.5 6.3* 6.4*  ALBUMIN 3.9 3.1* 3.1*   Recent Labs  Lab 06/07/19 2044  LIPASE 24   No results for input(s): AMMONIA in the last 168 hours. Coagulation Profile: No results for input(s): INR, PROTIME in the last 168 hours. Cardiac Enzymes: No results for input(s): CKTOTAL, CKMB, CKMBINDEX, TROPONINI in the last 168 hours. BNP (last 3 results) No results for input(s): PROBNP in the last 8760 hours. HbA1C: No results for input(s): HGBA1C in the  last 72 hours. CBG: No results for input(s): GLUCAP in the last 168 hours. Lipid Profile: No results for input(s): CHOL, HDL, LDLCALC, TRIG, CHOLHDL, LDLDIRECT in the last 72 hours. Thyroid Function Tests: No results for input(s): TSH, T4TOTAL, FREET4, T3FREE, THYROIDAB in the last 72 hours. Anemia Panel: No results for input(s): VITAMINB12, FOLATE, FERRITIN, TIBC, IRON, RETICCTPCT in the last 72 hours. Sepsis Labs: Recent Labs  Lab 06/07/19 2238 06/08/19 0630  LATICACIDVEN 1.6 1.0    Recent Results (from the past 240 hour(s))  Culture, blood (routine x 2)     Status: Abnormal (Preliminary result)   Collection Time: 06/07/19 10:30 PM   Specimen: BLOOD RIGHT HAND  Result Value Ref Range Status   Specimen Description BLOOD RIGHT HAND  Final   Special Requests   Final    BOTTLES DRAWN AEROBIC AND ANAEROBIC Blood Culture adequate volume   Culture  Setup Time   Final    GRAM NEGATIVE RODS IN BOTH AEROBIC AND ANAEROBIC BOTTLES CRITICAL RESULT CALLED TO, READ BACK BY AND VERIFIED WITH: E. MARTIN, PHARMD AT 1140 ON 06/08/19 BY C. JESSUP, MLT. Performed at [facility], [address] [place], [place] [address]    Culture ESCHERICHIA COLI (A)  Final   Report Status PENDING  Incomplete  Culture, blood (routine x 2)     Status: Abnormal (Preliminary result)   Collection Time: 06/07/19 10:45 PM   Specimen: BLOOD LEFT HAND  Result Value Ref Range Status   Specimen Description BLOOD LEFT HAND  Final   Special Requests   Final    BOTTLES DRAWN AEROBIC AND ANAEROBIC Blood Culture adequate volume   Culture  Setup Time   Final    GRAM NEGATIVE RODS IN BOTH AEROBIC AND ANAEROBIC BOTTLES CRITICAL RESULT CALLED TO, READ BACK BY AND VERIFIED WITH: E. MARTIN, PHARMD AT 1140 ON 06/08/19 BY C. JESSUP, MLT. Performed at [facility], [address] [place], [place] [address]    Culture ESCHERICHIA COLI (A)  Final   Report Status PENDING  Incomplete  Blood Culture ID Panel (Reflexed)      Status: Abnormal   Collection Time: 06/07/19 10:45 PM  Result Value Ref Range Status   Enterococcus species NOT DETECTED NOT DETECTED Final   Listeria monocytogenes NOT DETECTED NOT DETECTED Final   Staphylococcus species NOT DETECTED NOT DETECTED  Final   Staphylococcus aureus (BCID) NOT DETECTED NOT DETECTED Final   Streptococcus species NOT DETECTED NOT DETECTED Final   Streptococcus agalactiae NOT DETECTED NOT DETECTED Final   Streptococcus pneumoniae NOT DETECTED NOT DETECTED Final   Streptococcus pyogenes NOT DETECTED NOT DETECTED Final   Acinetobacter baumannii NOT DETECTED NOT DETECTED Final   Enterobacteriaceae species DETECTED (A) NOT DETECTED Final    Comment: Enterobacteriaceae represent a large family of gram-negative bacteria, not a single organism. CRITICAL RESULT CALLED TO, READ BACK BY AND VERIFIED WITH: E. MARTIN, PHARMD AT 1140 ON 06/08/19 BY C. JESSUP, MLT.    Enterobacter cloacae complex NOT DETECTED NOT DETECTED Final   Escherichia coli DETECTED (A) NOT DETECTED Final    Comment: CRITICAL RESULT CALLED TO, READ BACK BY AND VERIFIED WITH: E. MARTIN, PHARMD AT 1140 ON 06/08/19 BY C. JESSUP, MLT.    Klebsiella oxytoca NOT DETECTED NOT DETECTED Final   Klebsiella pneumoniae NOT DETECTED NOT DETECTED Final   Proteus species NOT DETECTED NOT DETECTED Final   Serratia marcescens NOT DETECTED NOT DETECTED Final   Carbapenem resistance NOT DETECTED NOT DETECTED Final   Haemophilus influenzae NOT DETECTED NOT DETECTED Final   Neisseria meningitidis NOT DETECTED NOT DETECTED Final   Pseudomonas aeruginosa NOT DETECTED NOT DETECTED Final   Candida albicans NOT DETECTED NOT DETECTED Final   Candida glabrata NOT DETECTED NOT DETECTED Final   Candida krusei NOT DETECTED NOT DETECTED Final   Candida parapsilosis NOT DETECTED NOT DETECTED Final   Candida tropicalis NOT DETECTED NOT DETECTED Final    Comment: Performed at [facility], [place] [address], [place],  [place] [address]  Urine culture     Status: Abnormal   Collection Time: 06/08/19 12:06 AM   Specimen: Urine, Random  Result Value Ref Range Status   Specimen Description URINE, RANDOM  Final   Special Requests NONE  Final   Culture (A)  Final    <10,000 COLONIES/mL INSIGNIFICANT GROWTH Performed at [facility], [place] [address] [place], [place] [address]    Report Status 06/09/2019 FINAL  Final  Novel Coronavirus,NAA,(SEND-OUT TO REF LAB - TAT 24-48 hrs); Hosp Order     Status: None   Collection Time: 06/08/19  5:51 AM   Specimen: Nasopharyngeal Swab; Respiratory  Result Value Ref Range Status   SARS-CoV-2, NAA NOT DETECTED NOT DETECTED Final    Comment: (NOTE) This test was developed and its performance characteristics determined by [employer]. This test has not been FDA cleared or approved. This test has been authorized by FDA under an Emergency Use Authorization (EUA). This test is only authorized for the duration of time the declaration that circumstances exist justifying the authorization of the emergency use of in vitro diagnostic tests for detection of SARS-CoV-2 virus and/or diagnosis of COVID-19 infection under section 564(b)(1) of the Act, 21 U.S.C. 631SHF-0(Y)(6), unless the authorization is terminated or revoked sooner. When diagnostic testing is negative, the possibility of a false negative result should be considered in the context of a patient's recent exposures and the presence of clinical signs and symptoms consistent with COVID-19. An individual without symptoms of COVID-19 and who is not shedding SARS-CoV-2 virus would expect to have a negative (not detected) result in this assay. Performed  At: [facility] [address] [place], [place] [id number] Rush Farmer MD DX:[id number]    [place]  Final    Comment: Performed at [facility], [place] [address] [place], [place] [address]  Radiology Studies: Ct  Abdomen Pelvis W Contrast  Result Date: 06/08/2019 CLINICAL DATA:  Abdominal pain. EXAM: CT ABDOMEN AND PELVIS WITH CONTRAST TECHNIQUE: Multidetector CT imaging of the abdomen and pelvis was performed using the standard protocol following bolus administration of intravenous contrast. CONTRAST:  100mL OMNIPAQUE IOHEXOL 300 MG/ML  SOLN COMPARISON:  None. FINDINGS: Lower chest: No acute abnormality. Hepatobiliary: No focal liver abnormality is seen. No gallstones, gallbladder wall thickening, or biliary dilatation. Pancreas: Unremarkable. No pancreatic ductal dilatation or surrounding inflammatory changes. Spleen: Normal in size without focal abnormality. Adrenals/Urinary Tract: There is fat stranding about the right kidney with mild right-sided collecting system dilatation. There is diffuse enhancement of the right ureter. There is no evidence of a radiopaque obstructing kidney stone. The left kidney is unremarkable with no evidence of a radiopaque obstructing stone. The bladder is unremarkable. Stomach/Bowel: There are scattered colonic diverticula without CT evidence of diverticulitis. The appendix is unremarkable. The stomach is unremarkable. There is no evidence of a small-bowel obstruction. Vascular/Lymphatic: Aortic atherosclerosis. No enlarged abdominal or pelvic lymph nodes. Reproductive: There is a small amount of fluid in the lower uterine segment. There is an right ovary cystic structure measuring approximately 3.2 cm. Other: No abdominal wall hernia or abnormality. No abdominopelvic ascites. Musculoskeletal: No acute or significant osseous findings. IMPRESSION: Overall findings consistent with right-sided pyelonephritis. Electronically Signed   By: Katherine Mantlehristopher  Green M.D.   On: 06/08/2019 01:24    Scheduled Meds: . enoxaparin (LOVENOX) injection  40 mg Subcutaneous Q24H  . sodium chloride flush  3 mL Intravenous Once   Continuous Infusions: . sodium chloride 75 mL/hr at 06/09/19 0359  .  meropenem (MERREM) IV 1 g (06/09/19 0614)     LOS: 0 days    Time spent: 25 minutes spent in the coordination of care today.    Teddy Spikeyrone A Reade Trefz, DO [facility] Pager [phone]  If 7PM-7AM, please contact night-coverage [email or web address] Password [email or web address] 06/09/2019, 12:19 PM

## 2019-06-10 ENCOUNTER — Inpatient Hospital Stay (HOSPITAL_COMMUNITY): Payer: Self-pay

## 2019-06-10 LAB — CBC WITH DIFFERENTIAL/PLATELET
Abs Immature Granulocytes: 0.11 10*3/uL — ABNORMAL HIGH (ref 0.00–0.07)
Basophils Absolute: 0.1 10*3/uL (ref 0.0–0.1)
Basophils Relative: 0 %
Eosinophils Absolute: 0.5 10*3/uL (ref 0.0–0.5)
Eosinophils Relative: 4 %
HCT: 29 % — ABNORMAL LOW (ref 36.0–46.0)
Hemoglobin: 9 g/dL — ABNORMAL LOW (ref 12.0–15.0)
Immature Granulocytes: 1 %
Lymphocytes Relative: 20 %
Lymphs Abs: 2.4 10*3/uL (ref 0.7–4.0)
MCH: 24.2 pg — ABNORMAL LOW (ref 26.0–34.0)
MCHC: 31 g/dL (ref 30.0–36.0)
MCV: 78 fL — ABNORMAL LOW (ref 80.0–100.0)
Monocytes Absolute: 1.5 10*3/uL — ABNORMAL HIGH (ref 0.1–1.0)
Monocytes Relative: 12 %
Neutro Abs: 7.4 10*3/uL (ref 1.7–7.7)
Neutrophils Relative %: 63 %
Platelets: 137 10*3/uL — ABNORMAL LOW (ref 150–400)
RBC: 3.72 MIL/uL — ABNORMAL LOW (ref 3.87–5.11)
RDW: 18 % — ABNORMAL HIGH (ref 11.5–15.5)
WBC: 11.9 10*3/uL — ABNORMAL HIGH (ref 4.0–10.5)
nRBC: 0 % (ref 0.0–0.2)

## 2019-06-10 LAB — RENAL FUNCTION PANEL
Albumin: 2.9 g/dL — ABNORMAL LOW (ref 3.5–5.0)
Anion gap: 6 (ref 5–15)
BUN: 5 mg/dL — ABNORMAL LOW (ref 6–20)
CO2: 23 mmol/L (ref 22–32)
Calcium: 8.5 mg/dL — ABNORMAL LOW (ref 8.9–10.3)
Chloride: 107 mmol/L (ref 98–111)
Creatinine, Ser: 1.02 mg/dL — ABNORMAL HIGH (ref 0.44–1.00)
GFR calc Af Amer: >60 mL/min (ref >60)
GFR calc non Af Amer: >60 mL/min (ref >60)
Glucose, Bld: 91 mg/dL (ref 70–99)
Phosphorus: 2.8 mg/dL (ref 2.5–4.6)
Potassium: 4.9 mmol/L (ref 3.5–5.1)
Sodium: 136 mmol/L (ref 135–145)

## 2019-06-10 LAB — CULTURE, BLOOD (ROUTINE X 2)
Special Requests: ADEQUATE
Special Requests: ADEQUATE

## 2019-06-10 LAB — MAGNESIUM: Magnesium: 2.1 mg/dL (ref 1.7–2.4)

## 2019-06-10 MED ORDER — LIDOCAINE VISCOUS HCL 2 % MT SOLN
15.0000 mL | Freq: Once | OROMUCOSAL | Status: AC
  Administered 2019-06-10: 15 mL via ORAL
  Filled 2019-06-10: qty 15

## 2019-06-10 MED ORDER — METOCLOPRAMIDE HCL 5 MG/ML IJ SOLN
10.0000 mg | Freq: Once | INTRAMUSCULAR | Status: AC
  Administered 2019-06-10: 10 mg via INTRAVENOUS
  Filled 2019-06-10: qty 2

## 2019-06-10 MED ORDER — GADOBUTROL 1 MMOL/ML IV SOLN
10.0000 mL | Freq: Once | INTRAVENOUS | Status: AC | PRN
  Administered 2019-06-10: 10 mL via INTRAVENOUS

## 2019-06-10 MED ORDER — HYDRALAZINE HCL 20 MG/ML IJ SOLN
10.0000 mg | Freq: Once | INTRAMUSCULAR | Status: AC
  Administered 2019-06-10: 10 mg via INTRAVENOUS
  Filled 2019-06-10: qty 1

## 2019-06-10 MED ORDER — AMOXICILLIN-POT CLAVULANATE 875-125 MG PO TABS
1.0000 | ORAL_TABLET | Freq: Two times a day (BID) | ORAL | Status: DC
  Administered 2019-06-11: 1 via ORAL
  Filled 2019-06-10: qty 1

## 2019-06-10 MED ORDER — ALUM & MAG HYDROXIDE-SIMETH 200-200-20 MG/5ML PO SUSP
30.0000 mL | Freq: Once | ORAL | Status: AC
  Administered 2019-06-10: 30 mL via ORAL
  Filled 2019-06-10: qty 30

## 2019-06-10 MED ORDER — DIPHENHYDRAMINE HCL 50 MG/ML IJ SOLN
25.0000 mg | Freq: Once | INTRAMUSCULAR | Status: AC
  Administered 2019-06-10: 25 mg via INTRAVENOUS
  Filled 2019-06-10: qty 1

## 2019-06-10 MED ORDER — KETOROLAC TROMETHAMINE 30 MG/ML IJ SOLN
30.0000 mg | Freq: Once | INTRAMUSCULAR | Status: AC
  Administered 2019-06-10: 30 mg via INTRAVENOUS
  Filled 2019-06-10: qty 1

## 2019-06-10 MED ORDER — CEFAZOLIN SODIUM-DEXTROSE 2-4 GM/100ML-% IV SOLN
2.0000 g | Freq: Three times a day (TID) | INTRAVENOUS | Status: AC
  Administered 2019-06-10 (×3): 2 g via INTRAVENOUS
  Filled 2019-06-10 (×3): qty 100

## 2019-06-10 MED ORDER — LISINOPRIL 20 MG PO TABS
20.0000 mg | ORAL_TABLET | Freq: Once | ORAL | Status: AC
  Administered 2019-06-10: 20 mg via ORAL
  Filled 2019-06-10: qty 1

## 2019-06-10 MED ORDER — LISINOPRIL 10 MG PO TABS: 10.0000 mg | ORAL_TABLET | Freq: Every day | ORAL | Status: DC

## 2019-06-10 NOTE — Progress Notes (Signed)
   06/10/19 2153  Vitals  BP (!) 179/106  MAP (mmHg) 128  BP Method Automatic  Pulse Rate 64  On call provider made aware. RN will continue to monitor.

## 2019-06-10 NOTE — Plan of Care (Signed)
  Problem: Pain Managment: Goal: General experience of comfort will improve Outcome: Progressing   

## 2019-06-10 NOTE — Progress Notes (Signed)
   06/10/19 0318  Pain Assessment  Pain Scale 0-10  Pain Score 10  Pain Type Acute pain  Pain Location Head (Eyes)  Pain Orientation Right;Left  Pain Descriptors / Indicators Aching;Throbbing  Pain Frequency Intermittent  Pain Onset On-going  Patients Stated Pain Goal 0  Pain Intervention(s) MD notified (Comment) (awaiting orders )

## 2019-06-10 NOTE — Plan of Care (Signed)
  Problem: Education: Goal: Knowledge of General Education information will improve Description Including pain rating scale, medication(s)/side effects and non-pharmacologic comfort measures Outcome: Progressing   

## 2019-06-10 NOTE — Progress Notes (Addendum)
Connie Rush.  PROGRESS NOTE    Connie Rush  OZH:[id number] DOB: [birth year] DOA: 06/07/2019 PCP: Patient, No Pcp Per   Brief Narrative:   Connie Castrois a 43 y.o.femalewith medical history significant ofHTN, migraines. Patient presents to the ED with worsening RLQ abd pain, N/V, headache. Symptoms onset 2 days ago. Pain radiates around into her lower back. Symptoms worsening. Feels like a kidney infection she had about 5 years ago. Tm 101 at home today.   Assessment & Plan:   Principal Problem:   Pyelonephritis Active Problems:   Neurocysticercosis   Headache   Microcytic anemia   Pyelonephritis/ESBL bacteremia - Rocephin - UCx and BCx pending - zofran PRN, Phenergan PRN refractory N/V - toradol PRN, Dilaudid PRN refractory pain     - rocephin changed to merrem d/t history of ESBL     - white count improved, a febrile ON     - merrem downgraded to cefazolin     - can likely change to augmentin in the AM; 875mg  BID to complete 14 abx regimen (End Date 06/20/2019)  Mildly Elevated serum b-HCG - 32.3 - Urine pregnancy test neg however - History of tubal ligation - No evidence of ectopic or other abnormality that could cause this detected on CT scan tonight - Will get a repeat serum B-HCG with AM labs this morning and see if its real (vs a false positive on initial serum test) and trending anywhere.  Elevated LFTs - repeat LFTs; check hepatitis panel if it remains elevated     - improved  HTN     - resume lisinopril  Headache     - reports a history of "brain cyst"; [facility]     - chart review shows history of neurocysticercosis [facility]; unable to reach them at this time     - MRI Brain results noted; neuro notified, no changes to care at this time, she can follow up with her regular neurologist     - history of chronic migraine; unable to afford migraine treatment  Microcytic anemia     - slow  trend down on Hgb. No frank bleed     - Fe deficient; currently w/ bacteremia, replete after completion of abx regimen  DVT prophylaxis: Lovenox Code Status: FULL   Disposition Plan: TBD   Antimicrobials:   cefazolin    Subjective: "I feel a little better."  Objective: Vitals:   06/09/19 2238 06/10/19 0050 06/10/19 0417 06/10/19 0937  BP: 136/85  138/83 (!) 151/92  Pulse: 73  68 67  Resp: (!) 21  20 18   Temp: 99.1 F (37.3 C)  97.8 F (36.6 C) 98.1 F (36.7 C)  TempSrc: Oral  Oral Oral  SpO2: 96%  96% 96%  Weight:  129.5 kg    Height:        Intake/Output Summary (Last 24 hours) at 06/10/2019 1332 Last data filed at 06/10/2019 1100 Gross per 24 hour  Intake 2792.93 ml  Output --  Net 2792.93 ml   Filed Weights   06/07/19 2205 06/09/19 0642 06/10/19 0050  Weight: 122.5 kg 127.9 kg 129.5 kg    Examination:  General:43 y.o.femaleresting in bed in NAD Cardiovascular: RRR, +S1, S2, no m/g/r, equal pulses throughout Respiratory: CTABL, no w/r/r, normal WOB GI: BS+, NDNT, no masses noted, no organomegaly noted MSK: No e/c/c, R CVAT improved Skin: No rashes, bruises, ulcerations noted Neuro: A&O x 3, no focal deficits    Data Reviewed: I  have personally reviewed following labs and imaging studies.  CBC: Recent Labs  Lab 06/07/19 2044 06/08/19 0630 06/09/19 0921 06/10/19 0622  WBC 24.8* 24.4* 19.0* 11.9*  NEUTROABS  --   --  14.7* 7.4  HGB 12.3 10.5* 9.5* 9.0*  HCT 38.7 32.9* 30.3* 29.0*  MCV 76.6* 77.2* 77.5* 78.0*  PLT 212 159 140* 137*   Basic Metabolic Panel: Recent Labs  Lab 06/07/19 2044 06/08/19 0630 06/09/19 0921 06/10/19 0622  NA 138 138 136 136  K 3.7 3.6 3.5 4.9  CL 106 109 107 107  CO2 17* 19* 22 23  GLUCOSE 132* 122* 101* 91  BUN 7 6 7  5*  CREATININE 1.19* 1.05* 1.09* 1.02*  CALCIUM 9.1 8.3* 8.7* 8.5*  MG  --   --  1.8 2.1  PHOS  --   --   --  2.8   GFR: Estimated Creatinine Clearance: 99.7 mL/min (A) (by C-G formula  based on SCr of 1.02 mg/dL (H)). Liver Function Tests: Recent Labs  Lab 06/07/19 2044 06/08/19 0644 06/09/19 0921 06/10/19 0622  AST 85* 56* 28  --   ALT 104* 78* 50*  --   ALKPHOS 129* 107 96  --   BILITOT 1.5* 0.6 0.5  --   PROT 7.5 6.3* 6.4*  --   ALBUMIN 3.9 3.1* 3.1* 2.9*   Recent Labs  Lab 06/07/19 2044  LIPASE 24   No results for input(s): AMMONIA in the last 168 hours. Coagulation Profile: No results for input(s): INR, PROTIME in the last 168 hours. Cardiac Enzymes: No results for input(s): CKTOTAL, CKMB, CKMBINDEX, TROPONINI in the last 168 hours. BNP (last 3 results) No results for input(s): PROBNP in the last 8760 hours. HbA1C: No results for input(s): HGBA1C in the last 72 hours. CBG: No results for input(s): GLUCAP in the last 168 hours. Lipid Profile: No results for input(s): CHOL, HDL, LDLCALC, TRIG, CHOLHDL, LDLDIRECT in the last 72 hours. Thyroid Function Tests: No results for input(s): TSH, T4TOTAL, FREET4, T3FREE, THYROIDAB in the last 72 hours. Anemia Panel: Recent Labs    06/09/19 1244  TIBC 462*  IRON 8*   Sepsis Labs: Recent Labs  Lab 06/07/19 2238 06/08/19 0630  LATICACIDVEN 1.6 1.0    Recent Results (from the past 240 hour(s))  Culture, blood (routine x 2)     Status: Abnormal   Collection Time: 06/07/19 10:30 PM   Specimen: BLOOD RIGHT HAND  Result Value Ref Range Status   Specimen Description BLOOD RIGHT HAND  Final   Special Requests   Final    BOTTLES DRAWN AEROBIC AND ANAEROBIC Blood Culture adequate volume   Culture  Setup Time   Final    GRAM NEGATIVE RODS IN BOTH AEROBIC AND ANAEROBIC BOTTLES CRITICAL RESULT CALLED TO, READ BACK BY AND VERIFIED WITH: E. MARTIN, PHARMD AT 1140 ON 06/08/19 BY C. JESSUP, MLT.    Culture (A)  Final    ESCHERICHIA COLI SUSCEPTIBILITIES PERFORMED ON PREVIOUS CULTURE WITHIN THE LAST 5 DAYS. Performed at [facility], [address] [place], [place] [address]    Report Status  06/10/2019 FINAL  Final  Culture, blood (routine x 2)     Status: Abnormal   Collection Time: 06/07/19 10:45 PM   Specimen: BLOOD LEFT HAND  Result Value Ref Range Status   Specimen Description BLOOD LEFT HAND  Final   Special Requests   Final    BOTTLES DRAWN AEROBIC AND ANAEROBIC Blood Culture adequate volume   Culture  Setup Time   Final    GRAM NEGATIVE RODS IN BOTH AEROBIC AND ANAEROBIC BOTTLES CRITICAL RESULT CALLED TO, READ BACK BY AND VERIFIED WITH: E. MARTIN, PHARMD AT 1140 ON 06/08/19 BY C. JESSUP, MLT. Performed at [facility], [place] [address], [place], [place] [address]    Culture ESCHERICHIA COLI (A)  Final   Report Status 06/10/2019 FINAL  Final   Organism ID, Bacteria ESCHERICHIA COLI  Final      Susceptibility   Escherichia coli - MIC*    AMPICILLIN 8 SENSITIVE Sensitive     CEFAZOLIN <=4 SENSITIVE Sensitive     CEFEPIME <=1 SENSITIVE Sensitive     CEFTAZIDIME <=1 SENSITIVE Sensitive     CEFTRIAXONE <=1 SENSITIVE Sensitive     CIPROFLOXACIN <=0.25 SENSITIVE Sensitive     GENTAMICIN <=1 SENSITIVE Sensitive     IMIPENEM <=0.25 SENSITIVE Sensitive     TRIMETH/SULFA <=20 SENSITIVE Sensitive     AMPICILLIN/SULBACTAM <=2 SENSITIVE Sensitive     PIP/TAZO <=4 SENSITIVE Sensitive     Extended ESBL NEGATIVE Sensitive     * ESCHERICHIA COLI  Blood Culture ID Panel (Reflexed)     Status: Abnormal   Collection Time: 06/07/19 10:45 PM  Result Value Ref Range Status   Enterococcus species NOT DETECTED NOT DETECTED Final   Listeria monocytogenes NOT DETECTED NOT DETECTED Final   Staphylococcus species NOT DETECTED NOT DETECTED Final   Staphylococcus aureus (BCID) NOT DETECTED NOT DETECTED Final   Streptococcus species NOT DETECTED NOT DETECTED Final   Streptococcus agalactiae NOT DETECTED NOT DETECTED Final   Streptococcus pneumoniae NOT DETECTED NOT DETECTED Final   Streptococcus pyogenes NOT DETECTED NOT DETECTED Final   Acinetobacter baumannii NOT DETECTED NOT  DETECTED Final   Enterobacteriaceae species DETECTED (A) NOT DETECTED Final    Comment: Enterobacteriaceae represent a large family of gram-negative bacteria, not a single organism. CRITICAL RESULT CALLED TO, READ BACK BY AND VERIFIED WITH: E. MARTIN, PHARMD AT 1140 ON 06/08/19 BY C. JESSUP, MLT.    Enterobacter cloacae complex NOT DETECTED NOT DETECTED Final   Escherichia coli DETECTED (A) NOT DETECTED Final    Comment: CRITICAL RESULT CALLED TO, READ BACK BY AND VERIFIED WITH: E. MARTIN, PHARMD AT 1140 ON 06/08/19 BY C. JESSUP, MLT.    Klebsiella oxytoca NOT DETECTED NOT DETECTED Final   Klebsiella pneumoniae NOT DETECTED NOT DETECTED Final   Proteus species NOT DETECTED NOT DETECTED Final   Serratia marcescens NOT DETECTED NOT DETECTED Final   Carbapenem resistance NOT DETECTED NOT DETECTED Final   Haemophilus influenzae NOT DETECTED NOT DETECTED Final   Neisseria meningitidis NOT DETECTED NOT DETECTED Final   Pseudomonas aeruginosa NOT DETECTED NOT DETECTED Final   Candida albicans NOT DETECTED NOT DETECTED Final   Candida glabrata NOT DETECTED NOT DETECTED Final   Candida krusei NOT DETECTED NOT DETECTED Final   Candida parapsilosis NOT DETECTED NOT DETECTED Final   Candida tropicalis NOT DETECTED NOT DETECTED Final    Comment: Performed at [facility], [place] [address] [place], [place] [address]  Urine culture     Status: Abnormal   Collection Time: 06/08/19 12:06 AM   Specimen: Urine, Random  Result Value Ref Range Status   Specimen Description URINE, RANDOM  Final   Special Requests NONE  Final   Culture (A)  Final    <10,000 COLONIES/mL INSIGNIFICANT GROWTH Performed at [facility], [place] [address], [place], [place] [address]    Report Status 06/09/2019 FINAL  Final  Novel Coronavirus,NAA,(SEND-OUT TO REF LAB - TAT 24-48 hrs); Hosp Order     Status: None   Collection Time: 06/08/19  5:51 AM   Specimen: Nasopharyngeal Swab; Respiratory  Result Value Ref Range  Status   SARS-CoV-2, NAA NOT DETECTED NOT DETECTED Final    Comment: (NOTE) This test was developed and its performance characteristics determined by [facility]. This test has not been FDA cleared or approved. This test has been authorized by FDA under an Emergency Use Authorization (EUA). This test is only authorized for the duration of time the declaration that circumstances exist justifying the authorization of the emergency use of in vitro diagnostic tests for detection of SARS-CoV-2 virus and/or diagnosis of COVID-19 infection under section 564(b)(1) of the Act, 21 U.S.C. 962XBM-8(U)(1360bbb-3(b)(1), unless the authorization is terminated or revoked sooner. When diagnostic testing is negative, the possibility of a false negative result should be considered in the context of a patient's recent exposures and the presence of clinical signs and symptoms consistent with COVID-19. An individual without symptoms of COVID-19 and who is not shedding SARS-CoV-2 virus would expect to have a negative (not detected) result in this assay. Performed  At: [facility] [address] [place], [place] [id number] Jolene SchimkeNagendra Sanjai MD OZ:[phone]    Coronavirus Source NASOPHARYNGEAL  Final    Comment: Performed at [facility], [address] [place], [place] [address]      Radiology Studies: Mr Laqueta JeanBrain W LOWo Contrast  Result Date: 06/10/2019 CLINICAL DATA:  History of neurocysticercosis. Headache, acute, normal neuro exam. EXAM: MRI HEAD WITHOUT AND WITH CONTRAST TECHNIQUE: Multiplanar, multiecho pulse sequences of the brain and surrounding structures were obtained without and with intravenous contrast. CONTRAST:  10 mL Gadavist COMPARISON:  [facility] FINDINGS: Brain: Peripheral cystic mass in the anterior left frontal lobe measures 2.0 x 2.7 x 1.7 cm. And internal nodule is present anteriorly. There is some peripheral enhancement  anteriorly. This is smaller than previously described. There is restricted diffusion within the nodule. Diffusion sequences are otherwise normal. No other focal lesions are evident. The ventricles are of normal size. No significant extraaxial fluid collection is present. The internal auditory canals are within normal limits. Vascular: Flow is present in the major intracranial arteries. Skull and upper cervical spine: Sella is expanded. Meckel's cave is asymmetrically enlarged on right. These findings were previously reported as well. Sinuses/Orbits: The paranasal sinuses and mastoid air cells are clear. The globes and orbits are within normal limits. IMPRESSION: 1. Stable to slight decrease in size of cystic lesion in the anterior left frontal lobe. Finding is consistent with neurocysticercosis. This is a chronic finding. 2. No acute intracranial abnormality. 3. Chronic expansion of the sella turcica. This is nonspecific, but can be seen in setting of increased intracranial hypertension. Electronically Signed   By: Marin Robertshristopher  Mattern M.D.   On: 06/10/2019 10:34     Scheduled Meds:  enoxaparin (LOVENOX) injection  40 mg Subcutaneous Q24H   sodium chloride flush  3 mL Intravenous Once   Continuous Infusions:  sodium chloride 75 mL/hr at 06/10/19 0416    ceFAZolin (ANCEF) IV 2 g (06/10/19 1314)     LOS: 1 day    Time spent: 25 minutes spent in the coordination of care.    Teddy Spikeyrone A Keyshun Elpers, DO [facility] Pager [phone]  If 7PM-7AM, please contact night-coverage [email or web address] Password [email or web address] 06/10/2019, 1:32 PM

## 2019-06-10 NOTE — Progress Notes (Signed)
Pt c/o sob and chest pressure. On call provider made aware. Awaiting orders. RN will continue to monitor.

## 2019-06-11 LAB — CBC WITH DIFFERENTIAL/PLATELET
Abs Immature Granulocytes: 0.15 K/uL — ABNORMAL HIGH (ref 0.00–0.07)
Basophils Absolute: 0.1 K/uL (ref 0.0–0.1)
Basophils Relative: 1 %
Eosinophils Absolute: 0.4 K/uL (ref 0.0–0.5)
Eosinophils Relative: 3 %
HCT: 30.3 % — ABNORMAL LOW (ref 36.0–46.0)
Hemoglobin: 9.9 g/dL — ABNORMAL LOW (ref 12.0–15.0)
Immature Granulocytes: 1 %
Lymphocytes Relative: 14 %
Lymphs Abs: 1.8 K/uL (ref 0.7–4.0)
MCH: 24.6 pg — ABNORMAL LOW (ref 26.0–34.0)
MCHC: 32.7 g/dL (ref 30.0–36.0)
MCV: 75.2 fL — ABNORMAL LOW (ref 80.0–100.0)
Monocytes Absolute: 1.3 K/uL — ABNORMAL HIGH (ref 0.1–1.0)
Monocytes Relative: 10 %
Neutro Abs: 9.5 K/uL — ABNORMAL HIGH (ref 1.7–7.7)
Neutrophils Relative %: 71 %
Platelets: 178 K/uL (ref 150–400)
RBC: 4.03 MIL/uL (ref 3.87–5.11)
RDW: 17.3 % — ABNORMAL HIGH (ref 11.5–15.5)
WBC: 13.3 K/uL — ABNORMAL HIGH (ref 4.0–10.5)
nRBC: 0 % (ref 0.0–0.2)

## 2019-06-11 LAB — RENAL FUNCTION PANEL
Albumin: 3.2 g/dL — ABNORMAL LOW (ref 3.5–5.0)
Anion gap: 10 (ref 5–15)
BUN: 6 mg/dL (ref 6–20)
CO2: 23 mmol/L (ref 22–32)
Calcium: 8.5 mg/dL — ABNORMAL LOW (ref 8.9–10.3)
Chloride: 105 mmol/L (ref 98–111)
Creatinine, Ser: 0.97 mg/dL (ref 0.44–1.00)
GFR calc Af Amer: >60 mL/min
GFR calc non Af Amer: >60 mL/min
Glucose, Bld: 129 mg/dL — ABNORMAL HIGH (ref 70–99)
Phosphorus: 2.6 mg/dL (ref 2.5–4.6)
Potassium: 3.1 mmol/L — ABNORMAL LOW (ref 3.5–5.1)
Sodium: 138 mmol/L (ref 135–145)

## 2019-06-11 LAB — MAGNESIUM: Magnesium: 1.8 mg/dL (ref 1.7–2.4)

## 2019-06-11 MED ORDER — LISINOPRIL 40 MG PO TABS: 40.0000 mg | ORAL_TABLET | Freq: Every day | ORAL | 2 refills | Status: DC

## 2019-06-11 MED ORDER — MAGNESIUM SULFATE 4 GM/100ML IV SOLN
4.0000 g | Freq: Once | INTRAVENOUS | Status: AC
  Administered 2019-06-11: 4 g via INTRAVENOUS
  Filled 2019-06-11: qty 100

## 2019-06-11 MED ORDER — HYDROMORPHONE HCL 2 MG PO TABS: 2.0000 mg | ORAL_TABLET | Freq: Two times a day (BID) | ORAL | 0 refills | Status: AC | PRN

## 2019-06-11 MED ORDER — METOCLOPRAMIDE HCL 5 MG/ML IJ SOLN
10.0000 mg | Freq: Once | INTRAMUSCULAR | Status: AC
  Administered 2019-06-11: 10 mg via INTRAVENOUS
  Filled 2019-06-11: qty 2

## 2019-06-11 MED ORDER — AMOXICILLIN-POT CLAVULANATE 875-125 MG PO TABS: 1.0000 | ORAL_TABLET | Freq: Two times a day (BID) | ORAL | 0 refills | Status: DC

## 2019-06-11 MED ORDER — LISINOPRIL 20 MG PO TABS: 40.0000 mg | ORAL_TABLET | Freq: Every day | ORAL | 1 refills | Status: DC

## 2019-06-11 MED ORDER — POTASSIUM CHLORIDE CRYS ER 20 MEQ PO TBCR
40.0000 meq | EXTENDED_RELEASE_TABLET | Freq: Once | ORAL | Status: AC
  Administered 2019-06-11: 40 meq via ORAL
  Filled 2019-06-11: qty 2

## 2019-06-11 MED ORDER — HYDRALAZINE HCL 20 MG/ML IJ SOLN
10.0000 mg | Freq: Once | INTRAMUSCULAR | Status: AC
  Administered 2019-06-11: 10 mg via INTRAVENOUS
  Filled 2019-06-11: qty 1

## 2019-06-11 MED ORDER — KETOROLAC TROMETHAMINE 30 MG/ML IJ SOLN
30.0000 mg | Freq: Once | INTRAMUSCULAR | Status: AC
  Administered 2019-06-11: 30 mg via INTRAVENOUS
  Filled 2019-06-11: qty 1

## 2019-06-11 MED ORDER — LISINOPRIL 20 MG PO TABS
20.0000 mg | ORAL_TABLET | Freq: Every day | ORAL | Status: DC
  Administered 2019-06-11: 20 mg via ORAL
  Filled 2019-06-11: qty 1

## 2019-06-11 MED ORDER — DIPHENHYDRAMINE HCL 50 MG/ML IJ SOLN
25.0000 mg | Freq: Once | INTRAMUSCULAR | Status: AC
  Administered 2019-06-11: 25 mg via INTRAVENOUS
  Filled 2019-06-11: qty 1

## 2019-06-11 MED FILL — HYDROmorphone HCL 2 MG TABS: 2 | 5 days supply | Qty: 10 | Fill #0

## 2019-06-11 MED FILL — LISINOPRIL 40 MG TABS: 40 | 30 days supply | Qty: 30 | Fill #0

## 2019-06-11 MED FILL — AMOX-CLAV 875-125 MG TABLET: 875-125 | 10 days supply | Qty: 20 | Fill #0

## 2019-06-11 NOTE — Discharge Summary (Signed)
Physician Discharge Summary  Connie HuaYulma Rush ZOX:[id number] DOB: 1975/12/27 DOA: 06/07/2019  PCP: Patient, No Pcp Per  Admit date: 06/07/2019 Discharge date: 06/11/2019  Admitted From: Home Disposition: Home  Recommendations for Outpatient Follow-up:  1. Follow up with PCP in 1-2 weeks 2. Please obtain BMP/CBC in one week 3. [facility]  Home Health none Equipment/Devices none  Discharge Condition: Stable and improved CODE STATUS full code  diet recommendation: Cardiac  brief/Interim Summary: 43 y.o.femalewith medical history significant ofHTN, migraines. Patient presents to the ED with worsening RLQ abd pain, N/V, headache. Symptoms onset 2 days ago. Pain radiates around into her lower back. Symptoms worsening. Feels like a kidney infection she had about 5 years ago. Tm 101 at home today.    Discharge Diagnoses:  Principal Problem:   Pyelonephritis Active Problems:   Neurocysticercosis   Headache   Microcytic anemia  Pyelonephritis/ESBL bacteremia-blood culture grew E. coli sensitive to Rocephin we will discharge her on Augmentin for 10 days.  Mildly Elevated serum b-HCG - 32.3 - Urine pregnancy test neg however - History of tubal ligation - No evidence of ectopic or other abnormality that could cause this detected on CT scan tonight  Elevated LFTs-follow-up LFTs as an outpatient   HTN     - resume lisinopril I have increased the dose of lisinopril to 40 mg daily since her blood pressure stayed high on 20 mg.  Headache - reports a history of "brain cyst"; [facility]  MRI shows decrease in the size of the cyst.  Estimated body mass index is 43.74 kg/m as calculated from the following:   Height as of this encounter: 5\' 7"  (1.702 m).   Weight as of this encounter: 126.7 kg.  Discharge Instructions  Discharge Instructions    Diet - low sodium heart healthy   Complete by: As  directed    Increase activity slowly   Complete by: As directed      Allergies as of 06/11/2019   No Known Allergies     Medication List    TAKE these medications   amoxicillin-clavulanate 875-125 MG tablet Commonly known as: AUGMENTIN Take 1 tablet by mouth 2 (two) times daily.   HYDROmorphone 2 MG tablet Commonly known as: Dilaudid Take 1 tablet (2 mg total) by mouth every 12 (twelve) hours as needed for up to 5 days for severe pain.   lisinopril 40 MG tablet Commonly known as: ZESTRIL Take 1 tablet (40 mg total) by mouth daily. What changed:   medication strength  how much to take       No Known Allergies  Consultations:  none   Procedures/Studies: Mr Laqueta JeanBrain W Wo Contrast  Result Date: 06/10/2019 CLINICAL DATA:  History of neurocysticercosis. Headache, acute, normal neuro exam. EXAM: MRI HEAD WITHOUT AND WITH CONTRAST TECHNIQUE: Multiplanar, multiecho pulse sequences of the brain and surrounding structures were obtained without and with intravenous contrast. CONTRAST:  10 mL Gadavist COMPARISON:  Report of MRI brain 07/18/2017 [facility]. FINDINGS: Brain: Peripheral cystic mass in the anterior left frontal lobe measures 2.0 x 2.7 x 1.7 cm. And internal nodule is present anteriorly. There is some peripheral enhancement anteriorly. This is smaller than previously described. There is restricted diffusion within the nodule. Diffusion sequences are otherwise normal. No other focal lesions are evident. The ventricles are of normal size. No significant extraaxial fluid collection is present. The internal auditory canals are within normal limits. Vascular: Flow is present  in the major intracranial arteries. Skull and upper cervical spine: Sella is expanded. Meckel's cave is asymmetrically enlarged on right. These findings were previously reported as well. Sinuses/Orbits: The paranasal sinuses and mastoid air cells are clear. The globes and orbits are within  normal limits. IMPRESSION: 1. Stable to slight decrease in size of cystic lesion in the anterior left frontal lobe. Finding is consistent with neurocysticercosis. This is a chronic finding. 2. No acute intracranial abnormality. 3. Chronic expansion of the sella turcica. This is nonspecific, but can be seen in setting of increased intracranial hypertension. Electronically Signed   By: Marin Robertshristopher  Mattern M.D.   On: 06/10/2019 10:34   Ct Abdomen Pelvis W Contrast  Result Date: 06/08/2019 CLINICAL DATA:  Abdominal pain. EXAM: CT ABDOMEN AND PELVIS WITH CONTRAST TECHNIQUE: Multidetector CT imaging of the abdomen and pelvis was performed using the standard protocol following bolus administration of intravenous contrast. CONTRAST:  100mL OMNIPAQUE IOHEXOL 300 MG/ML  SOLN COMPARISON:  None. FINDINGS: Lower chest: No acute abnormality. Hepatobiliary: No focal liver abnormality is seen. No gallstones, gallbladder wall thickening, or biliary dilatation. Pancreas: Unremarkable. No pancreatic ductal dilatation or surrounding inflammatory changes. Spleen: Normal in size without focal abnormality. Adrenals/Urinary Tract: There is fat stranding about the right kidney with mild right-sided collecting system dilatation. There is diffuse enhancement of the right ureter. There is no evidence of a radiopaque obstructing kidney stone. The left kidney is unremarkable with no evidence of a radiopaque obstructing stone. The bladder is unremarkable. Stomach/Bowel: There are scattered colonic diverticula without CT evidence of diverticulitis. The appendix is unremarkable. The stomach is unremarkable. There is no evidence of a small-bowel obstruction. Vascular/Lymphatic: Aortic atherosclerosis. No enlarged abdominal or pelvic lymph nodes. Reproductive: There is a small amount of fluid in the lower uterine segment. There is an right ovary cystic structure measuring approximately 3.2 cm. Other: No abdominal wall hernia or abnormality. No  abdominopelvic ascites. Musculoskeletal: No acute or significant osseous findings. IMPRESSION: Overall findings consistent with right-sided pyelonephritis. Electronically Signed   By: Katherine Mantlehristopher  Green M.D.   On: 06/08/2019 01:24   (Echo, Carotid, EGD, Colonoscopy, ERCP)    Subjective: Patient resting in bed complains of a headache has history of chronic headache and migraine.  Discharge Exam: Vitals:   06/11/19 0447 06/11/19 0936  BP: 135/70 (!) 153/85  Pulse: 62 62  Resp: 16 18  Temp: 98.9 F (37.2 C) 98.5 F (36.9 C)  SpO2: 97% 94%   Vitals:   06/10/19 2321 06/11/19 0108 06/11/19 0447 06/11/19 0936  BP: (!) 186/102 (!) 136/122 135/70 (!) 153/85  Pulse: 63 92 62 62  Resp:   16 18  Temp:   98.9 F (37.2 C) 98.5 F (36.9 C)  TempSrc:   Oral Oral  SpO2:  100% 97% 94%  Weight:   126.7 kg   Height:        General: Pt is alert, awake, not in acute distress Cardiovascular: RRR, S1/S2 +, no rubs, no gallops Respiratory: CTA bilaterally, no wheezing, no rhonchi Abdominal: Soft, NT, ND, bowel sounds + Extremities: no edema, no cyanosis    The results of significant diagnostics from this hospitalization (including imaging, microbiology, ancillary and laboratory) are listed below for reference.     Microbiology: Recent Results (from the past 240 hour(s))  Culture, blood (routine x 2)     Status: Abnormal   Collection Time: 06/07/19 10:30 PM   Specimen: BLOOD RIGHT HAND  Result Value Ref Range Status   Specimen Description BLOOD  RIGHT HAND  Final   Special Requests   Final    BOTTLES DRAWN AEROBIC AND ANAEROBIC Blood Culture adequate volume   Culture  Setup Time   Final    GRAM NEGATIVE RODS IN BOTH AEROBIC AND ANAEROBIC BOTTLES CRITICAL RESULT CALLED TO, READ BACK BY AND VERIFIED WITH: E. MARTIN, PHARMD AT 1140 ON 06/08/19 BY C. JESSUP, MLT.    Culture (A)  Final    ESCHERICHIA COLI SUSCEPTIBILITIES PERFORMED ON PREVIOUS CULTURE WITHIN THE LAST 5 DAYS. Performed at  [facility], [address] [place], [place] [address]    Report Status 06/10/2019 FINAL  Final  Culture, blood (routine x 2)     Status: Abnormal   Collection Time: 06/07/19 10:45 PM   Specimen: BLOOD LEFT HAND  Result Value Ref Range Status   Specimen Description BLOOD LEFT HAND  Final   Special Requests   Final    BOTTLES DRAWN AEROBIC AND ANAEROBIC Blood Culture adequate volume   Culture  Setup Time   Final    GRAM NEGATIVE RODS IN BOTH AEROBIC AND ANAEROBIC BOTTLES CRITICAL RESULT CALLED TO, READ BACK BY AND VERIFIED WITH: E. MARTIN, PHARMD AT 1140 ON 06/08/19 BY C. JESSUP, MLT. Performed at [facility], [address] [place], [place] [address]    Culture ESCHERICHIA COLI (A)  Final   Report Status 06/10/2019 FINAL  Final   Organism ID, Bacteria ESCHERICHIA COLI  Final      Susceptibility   Escherichia coli - MIC*    AMPICILLIN 8 SENSITIVE Sensitive     CEFAZOLIN <=4 SENSITIVE Sensitive     CEFEPIME <=1 SENSITIVE Sensitive     CEFTAZIDIME <=1 SENSITIVE Sensitive     CEFTRIAXONE <=1 SENSITIVE Sensitive     CIPROFLOXACIN <=0.25 SENSITIVE Sensitive     GENTAMICIN <=1 SENSITIVE Sensitive     IMIPENEM <=0.25 SENSITIVE Sensitive     TRIMETH/SULFA <=20 SENSITIVE Sensitive     AMPICILLIN/SULBACTAM <=2 SENSITIVE Sensitive     PIP/TAZO <=4 SENSITIVE Sensitive     Extended ESBL NEGATIVE Sensitive     * ESCHERICHIA COLI  Blood Culture ID Panel (Reflexed)     Status: Abnormal   Collection Time: 06/07/19 10:45 PM  Result Value Ref Range Status   Enterococcus species NOT DETECTED NOT DETECTED Final   Listeria monocytogenes NOT DETECTED NOT DETECTED Final   Staphylococcus species NOT DETECTED NOT DETECTED Final   Staphylococcus aureus (BCID) NOT DETECTED NOT DETECTED Final   Streptococcus species NOT DETECTED NOT DETECTED Final   Streptococcus agalactiae NOT DETECTED NOT DETECTED Final   Streptococcus pneumoniae NOT DETECTED NOT DETECTED Final   Streptococcus pyogenes  NOT DETECTED NOT DETECTED Final   Acinetobacter baumannii NOT DETECTED NOT DETECTED Final   Enterobacteriaceae species DETECTED (A) NOT DETECTED Final    Comment: Enterobacteriaceae represent a large family of gram-negative bacteria, not a single organism. CRITICAL RESULT CALLED TO, READ BACK BY AND VERIFIED WITH: E. MARTIN, PHARMD AT 1140 ON 06/08/19 BY C. JESSUP, MLT.    Enterobacter cloacae complex NOT DETECTED NOT DETECTED Final   Escherichia coli DETECTED (A) NOT DETECTED Final    Comment: CRITICAL RESULT CALLED TO, READ BACK BY AND VERIFIED WITH: E. MARTIN, PHARMD AT 1140 ON 06/08/19 BY C. JESSUP, MLT.    Klebsiella oxytoca NOT DETECTED NOT DETECTED Final   Klebsiella pneumoniae NOT DETECTED NOT DETECTED Final   Proteus species NOT DETECTED NOT DETECTED Final   Serratia marcescens NOT DETECTED NOT DETECTED Final  Carbapenem resistance NOT DETECTED NOT DETECTED Final   Haemophilus influenzae NOT DETECTED NOT DETECTED Final   Neisseria meningitidis NOT DETECTED NOT DETECTED Final   Pseudomonas aeruginosa NOT DETECTED NOT DETECTED Final   Candida albicans NOT DETECTED NOT DETECTED Final   Candida glabrata NOT DETECTED NOT DETECTED Final   Candida krusei NOT DETECTED NOT DETECTED Final   Candida parapsilosis NOT DETECTED NOT DETECTED Final   Candida tropicalis NOT DETECTED NOT DETECTED Final    Comment: Performed at [facility], [address], [place], [place] [address]  Urine culture     Status: Abnormal   Collection Time: 06/08/19 12:06 AM   Specimen: Urine, Random  Result Value Ref Range Status   Specimen Description URINE, RANDOM  Final   Special Requests NONE  Final   Culture (A)  Final    <10,000 COLONIES/mL INSIGNIFICANT GROWTH Performed at [facility], [address] [place], [place] [address]    Report Status 06/09/2019 FINAL  Final  Novel Coronavirus,NAA,(SEND-OUT TO REF LAB - TAT 24-48 hrs); Hosp Order     Status: None   Collection Time: 06/08/19   5:51 AM   Specimen: Nasopharyngeal Swab; Respiratory  Result Value Ref Range Status   SARS-CoV-2, NAA NOT DETECTED NOT DETECTED Final    Comment: (NOTE) This test was developed and its performance characteristics determined by [employer]. This test has not been FDA cleared or approved. This test has been authorized by FDA under an Emergency Use Authorization (EUA). This test is only authorized for the duration of time the declaration that circumstances exist justifying the authorization of the emergency use of in vitro diagnostic tests for detection of SARS-CoV-2 virus and/or diagnosis of COVID-19 infection under section 564(b)(1) of the Act, 21 U.S.C. 914NWG-9(F)(6), unless the authorization is terminated or revoked sooner. When diagnostic testing is negative, the possibility of a false negative result should be considered in the context of a patient's recent exposures and the presence of clinical signs and symptoms consistent with COVID-19. An individual without symptoms of COVID-19 and who is not shedding SARS-CoV-2 virus would expect to have a negative (not detected) result in this assay. Performed  At: [facility] [address] [place], [place] [id number] Jolene Schimke MD IO:[id number]    Coronavirus Source NASOPHARYNGEAL  Final    Comment: Performed at [facility], [address] [place], [place] [address]     Labs: BNP (last 3 results) No results for input(s): BNP in the last 8760 hours. Basic Metabolic Panel: Recent Labs  Lab 06/07/19 2044 06/08/19 0630 06/09/19 0921 06/10/19 0622 06/11/19 0257  NA 138 138 136 136 138  K 3.7 3.6 3.5 4.9 3.1*  CL 106 109 107 107 105  CO2 17* 19* GLUCOSE 132* 122* 101* 91 129*  BUN 5* 6  CREATININE 1.19* 1.05* 1.09* 1.02* 0.97  CALCIUM 9.1 8.3* 8.7* 8.5* 8.5*  MG  --   --  1.8 2.1 1.8  PHOS  --   --   --  2.8 2.6   Liver Function Tests: Recent Labs  Lab 06/07/19 2044  06/08/19 0644 06/09/19 0921 06/10/19 0622 06/11/19 0257  AST 85* 56* 28  --   --   ALT 104* 78* 50*  --   --   ALKPHOS 129* 107 96  --   --   BILITOT 1.5* 0.6 0.5  --   --   PROT 7.5 6.3* 6.4*  --   --  ALBUMIN 3.9 3.1* 3.1* 2.9* 3.2*   Recent Labs  Lab 06/07/19 2044  LIPASE 24   No results for input(s): AMMONIA in the last 168 hours. CBC: Recent Labs  Lab 06/07/19 2044 06/08/19 0630 06/09/19 0921 06/10/19 0622 06/11/19 0257  WBC 24.8* 24.4* 19.0* 11.9* 13.3*  NEUTROABS  --   --  14.7* 7.4 9.5*  HGB 12.3 10.5* 9.5* 9.0* 9.9*  HCT 38.7 32.9* 30.3* 29.0* 30.3*  MCV 76.6* 77.2* 77.5* 78.0* 75.2*  PLT 212 159 140* 137* 178   Cardiac Enzymes: No results for input(s): CKTOTAL, CKMB, CKMBINDEX, TROPONINI in the last 168 hours. BNP: Invalid input(s): POCBNP CBG: No results for input(s): GLUCAP in the last 168 hours. D-Dimer No results for input(s): DDIMER in the last 72 hours. Hgb A1c No results for input(s): HGBA1C in the last 72 hours. Lipid Profile No results for input(s): CHOL, HDL, LDLCALC, TRIG, CHOLHDL, LDLDIRECT in the last 72 hours. Thyroid function studies No results for input(s): TSH, T4TOTAL, T3FREE, THYROIDAB in the last 72 hours.  Invalid input(s): FREET3 Anemia work up Recent Labs    06/09/19 1244  TIBC 462*  IRON 8*   Urinalysis    Component Value Date/Time   COLORURINE AMBER (A) 06/07/2019 2120   APPEARANCEUR CLOUDY (A) 06/07/2019 2120   LABSPEC 1.023 06/07/2019 2120   PHURINE 5.0 06/07/2019 2120   GLUCOSEU NEGATIVE 06/07/2019 2120   HGBUR MODERATE (A) 06/07/2019 2120   BILIRUBINUR NEGATIVE 06/07/2019 2120   KETONESUR NEGATIVE 06/07/2019 2120   PROTEINUR 100 (A) 06/07/2019 2120   NITRITE NEGATIVE 06/07/2019 2120   LEUKOCYTESUR LARGE (A) 06/07/2019 2120   Sepsis Labs Invalid input(s): PROCALCITONIN,  WBC,  LACTICIDVEN Microbiology Recent Results (from the past 240 hour(s))  Culture, blood (routine x 2)     Status: Abnormal    Collection Time: 06/07/19 10:30 PM   Specimen: BLOOD RIGHT HAND  Result Value Ref Range Status   Specimen Description BLOOD RIGHT HAND  Final   Special Requests   Final    BOTTLES DRAWN AEROBIC AND ANAEROBIC Blood Culture adequate volume   Culture  Setup Time   Final    GRAM NEGATIVE RODS IN BOTH AEROBIC AND ANAEROBIC BOTTLES CRITICAL RESULT CALLED TO, READ BACK BY AND VERIFIED WITH: E. MARTIN, PHARMD AT 1140 ON 06/08/19 BY C. JESSUP, MLT.    Culture (A)  Final    ESCHERICHIA COLI SUSCEPTIBILITIES PERFORMED ON PREVIOUS CULTURE WITHIN THE LAST 5 DAYS. Performed at [facility], [place] [address] [place], [place] [address]    Report Status 06/10/2019 FINAL  Final  Culture, blood (routine x 2)     Status: Abnormal   Collection Time: 06/07/19 10:45 PM   Specimen: BLOOD LEFT HAND  Result Value Ref Range Status   Specimen Description BLOOD LEFT HAND  Final   Special Requests   Final    BOTTLES DRAWN AEROBIC AND ANAEROBIC Blood Culture adequate volume   Culture  Setup Time   Final    GRAM NEGATIVE RODS IN BOTH AEROBIC AND ANAEROBIC BOTTLES CRITICAL RESULT CALLED TO, READ BACK BY AND VERIFIED WITH: E. MARTIN, PHARMD AT 1140 ON 06/08/19 BY C. JESSUP, MLT. Performed at [facility], [place] [address] [place],  [address]    Culture ESCHERICHIA COLI (A)  Final   Report Status 06/10/2019 FINAL  Final   Organism ID, Bacteria ESCHERICHIA COLI  Final      Susceptibility   Escherichia coli - MIC*    AMPICILLIN 8  SENSITIVE Sensitive     CEFAZOLIN <=4 SENSITIVE Sensitive     CEFEPIME <=1 SENSITIVE Sensitive     CEFTAZIDIME <=1 SENSITIVE Sensitive     CEFTRIAXONE <=1 SENSITIVE Sensitive     CIPROFLOXACIN <=0.25 SENSITIVE Sensitive     GENTAMICIN <=1 SENSITIVE Sensitive     IMIPENEM <=0.25 SENSITIVE Sensitive     TRIMETH/SULFA <=20 SENSITIVE Sensitive     AMPICILLIN/SULBACTAM <=2 SENSITIVE Sensitive     PIP/TAZO <=4 SENSITIVE Sensitive     Extended ESBL NEGATIVE Sensitive     *  ESCHERICHIA COLI  Blood Culture ID Panel (Reflexed)     Status: Abnormal   Collection Time: 06/07/19 10:45 PM  Result Value Ref Range Status   Enterococcus species NOT DETECTED NOT DETECTED Final   Listeria monocytogenes NOT DETECTED NOT DETECTED Final   Staphylococcus species NOT DETECTED NOT DETECTED Final   Staphylococcus aureus (BCID) NOT DETECTED NOT DETECTED Final   Streptococcus species NOT DETECTED NOT DETECTED Final   Streptococcus agalactiae NOT DETECTED NOT DETECTED Final   Streptococcus pneumoniae NOT DETECTED NOT DETECTED Final   Streptococcus pyogenes NOT DETECTED NOT DETECTED Final   Acinetobacter baumannii NOT DETECTED NOT DETECTED Final   Enterobacteriaceae species DETECTED (A) NOT DETECTED Final    Comment: Enterobacteriaceae represent a large family of gram-negative bacteria, not a single organism. CRITICAL RESULT CALLED TO, READ BACK BY AND VERIFIED WITH: E. MARTIN, PHARMD AT 1140 ON 06/08/19 BY C. JESSUP, MLT.    Enterobacter cloacae complex NOT DETECTED NOT DETECTED Final   Escherichia coli DETECTED (A) NOT DETECTED Final    Comment: CRITICAL RESULT CALLED TO, READ BACK BY AND VERIFIED WITH: E. MARTIN, PHARMD AT 1140 ON 06/08/19 BY C. JESSUP, MLT.    Klebsiella oxytoca NOT DETECTED NOT DETECTED Final   Klebsiella pneumoniae NOT DETECTED NOT DETECTED Final   Proteus species NOT DETECTED NOT DETECTED Final   Serratia marcescens NOT DETECTED NOT DETECTED Final   Carbapenem resistance NOT DETECTED NOT DETECTED Final   Haemophilus influenzae NOT DETECTED NOT DETECTED Final   Neisseria meningitidis NOT DETECTED NOT DETECTED Final   Pseudomonas aeruginosa NOT DETECTED NOT DETECTED Final   Candida albicans NOT DETECTED NOT DETECTED Final   Candida glabrata NOT DETECTED NOT DETECTED Final   Candida krusei NOT DETECTED NOT DETECTED Final   Candida parapsilosis NOT DETECTED NOT DETECTED Final   Candida tropicalis NOT DETECTED NOT DETECTED Final    Comment: Performed at  [facility], [address], [place], [place] [address]  Urine culture     Status: Abnormal   Collection Time: 06/08/19 12:06 AM   Specimen: Urine, Random  Result Value Ref Range Status   Specimen Description URINE, RANDOM  Final   Special Requests NONE  Final   Culture (A)  Final    <10,000 COLONIES/mL INSIGNIFICANT GROWTH Performed at [facility], [address] [place], [place] [address]    Report Status 06/09/2019 FINAL  Final  Novel Coronavirus,NAA,(SEND-OUT TO REF LAB - TAT 24-48 hrs); Hosp Order     Status: None   Collection Time: 06/08/19  5:51 AM   Specimen: Nasopharyngeal Swab; Respiratory  Result Value Ref Range Status   SARS-CoV-2, NAA NOT DETECTED NOT DETECTED Final    Comment: (NOTE) This test was developed and its performance characteristics determined by [employer]. This test has not been FDA cleared or approved. This test has been authorized by FDA under an Emergency Use Authorization (EUA). This test is only authorized  for the duration of time the declaration that circumstances exist justifying the authorization of the emergency use of in vitro diagnostic tests for detection of SARS-CoV-2 virus and/or diagnosis of COVID-19 infection under section 564(b)(1) of the Act, 21 U.S.C. 782NFA-2(Z)(3), unless the authorization is terminated or revoked sooner. When diagnostic testing is negative, the possibility of a false negative result should be considered in the context of a patient's recent exposures and the presence of clinical signs and symptoms consistent with COVID-19. An individual without symptoms of COVID-19 and who is not shedding SARS-CoV-2 virus would expect to have a negative (not detected) result in this assay. Performed  At: [facility] [address] [place], [place] [id number] Jolene Schimke MD GE:[id number]    Coronavirus Source NASOPHARYNGEAL  Final    Comment: Performed at [facility], [address],  [place], [place] [address]     Time coordinating discharge:  34 minutes  SIGNED:   Alwyn Ren, MD  [facility] 06/11/2019, 1:20 PM Pager   If 7PM-7AM, please contact night-coverage [email or web address] Password [email or web address]

## 2019-06-11 NOTE — TOC Transition Note (Signed)
Transition of Care [facility] - CM/SW Discharge Note   Patient Details  Name: Connie Rush MRN: [id number] Date of Birth: 10-06-1976  [facility]) CM/SW Contact:  Bartholomew Crews, RN Phone Number: [phone] 06/11/2019, 3:19 PM   Clinical Narrative:    Spoke with patient at bedside concerning discharge. Patient familiar with [facility] - scripts would be almost $40 roughtly. Discussed TOC pharmacy to fill prescriptions. Patient in agreement. [facility] delivered [facility]. No Match at this time d/t one med was narcotic, one med was on $4 list, and the other was an antibiotic. Meds filled by TOC for $19. Patient reported that she could afford this. Patient states her ride is waiting for her to be ready. No other transition of care needs identified.    Final next level of care: Home/Self Care Barriers to Discharge: No Barriers Identified   Patient Goals and CMS Choice        Discharge Placement     Home                  Discharge Plan and Services                DME Arranged: N/A DME Agency: NA       HH Arranged: NA HH Agency: NA        Social Determinants of Health (SDOH) Interventions     Readmission Risk Interventions No flowsheet data found.

## 2019-06-11 NOTE — Progress Notes (Signed)
DISCHARGE NOTE HOME Connie Rush to be discharged Home per MD order. Discussed prescriptions and follow up appointments with the patient. Prescriptions given to patient; medication list explained in detail. Patient verbalized understanding.  Skin clean, dry and intact without evidence of skin break down, no evidence of skin tears noted. IV catheter discontinued intact. Site without signs and symptoms of complications. Dressing and pressure applied. Pt denies pain at the site currently. No complaints noted.  Patient free of lines, drains, and wounds.   An After Visit Summary (AVS) was printed and given to the patient. Patient escorted via wheelchair, and discharged home via private auto.  Orville Govern, RN3

## 2019-06-11 NOTE — Plan of Care (Signed)
  Problem: Education: Goal: Knowledge of General Education information will improve Description: Including pain rating scale, medication(s)/side effects and non-pharmacologic comfort measures Outcome: Completed/Met   Problem: Clinical Measurements: Goal: Ability to maintain clinical measurements within normal limits will improve Outcome: Completed/Met Goal: Will remain free from infection Outcome: Completed/Met Goal: Diagnostic test results will improve Outcome: Completed/Met Goal: Respiratory complications will improve Outcome: Completed/Met Goal: Cardiovascular complication will be avoided Outcome: Completed/Met   Problem: Activity: Goal: Risk for activity intolerance will decrease Outcome: Completed/Met   

## 2020-12-26 IMAGING — CT CT ABDOMEN AND PELVIS WITH CONTRAST
2 of 6 series · 16 of 46 positions shown, 18 images · IV contrast (Omni 300)
Comparison: None.

CLINICAL DATA: Abdominal pain.

EXAM:
CT ABDOMEN AND PELVIS WITH CONTRAST
TECHNIQUE: Multidetector CT imaging of the abdomen and pelvis was performed
using the standard protocol following bolus administration of
intravenous contrast.
CONTRAST:  100mL OMNIPAQUE IOHEXOL 300 MG/ML  SOLN

[Series 3: a/p w/ 5mm · axial · 0.98mm/px · z∈[+720,+1230]mm · 13 of 114 slices shown, 15 images]
[im 6/114  soft-tissue]
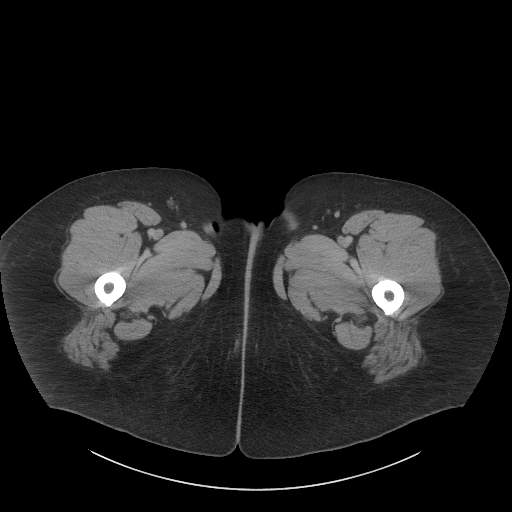
[im 6/114  bone]
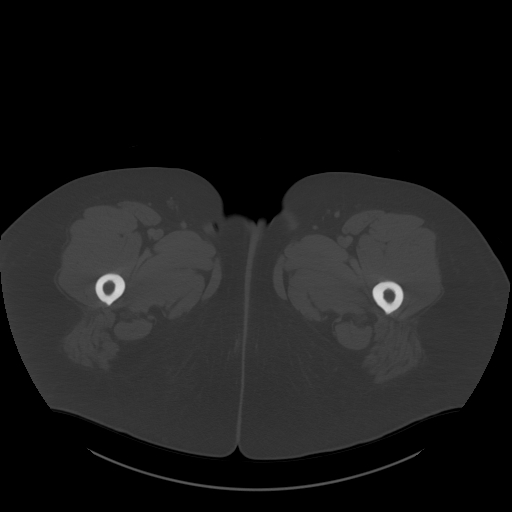
[im 18/114  soft-tissue]
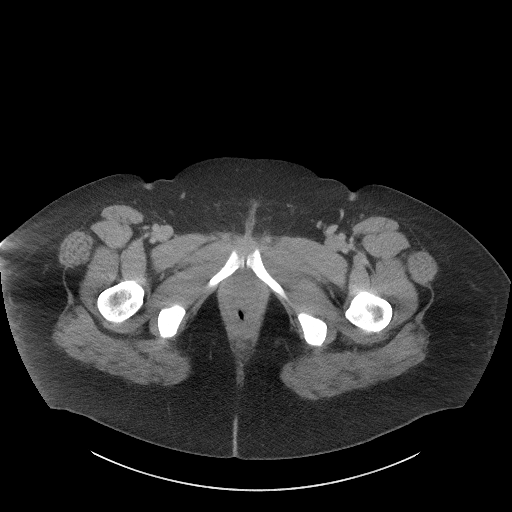
[im 24/114  soft-tissue]
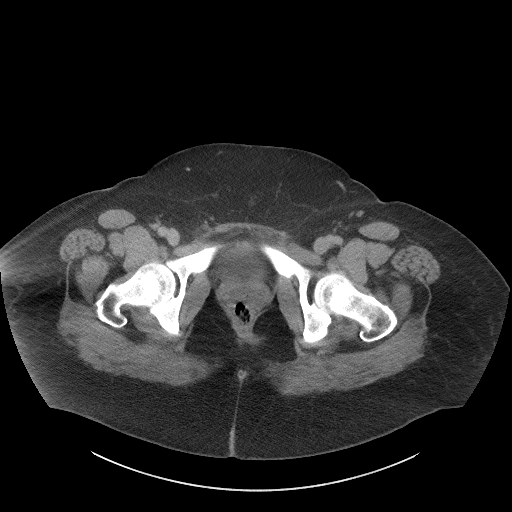
[im 30/114  soft-tissue]
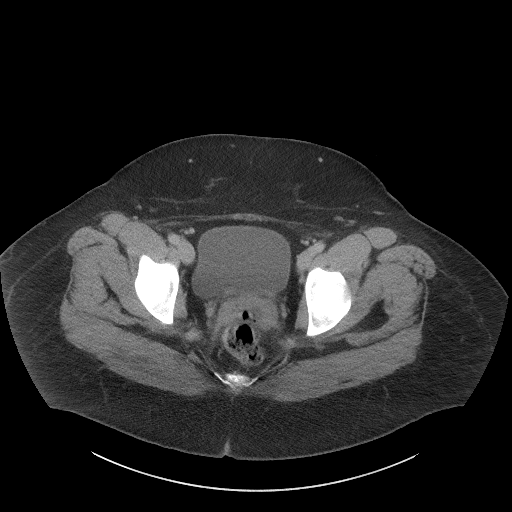
[im 42/114  soft-tissue]
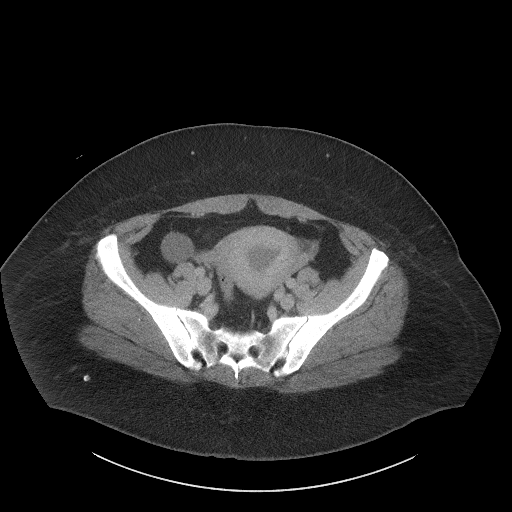
[im 48/114  soft-tissue]
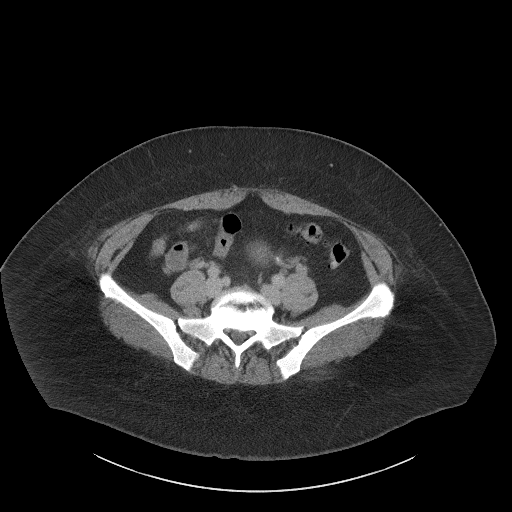
[im 60/114  soft-tissue]
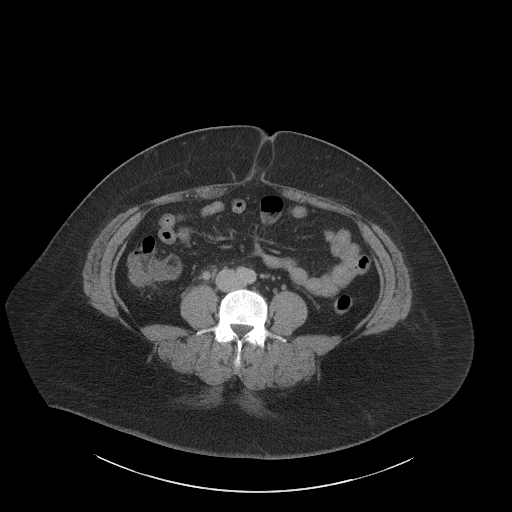
[im 66/114  soft-tissue]
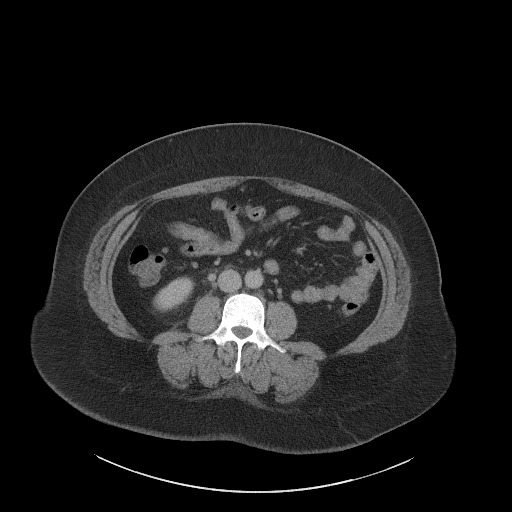
[im 72/114  soft-tissue]
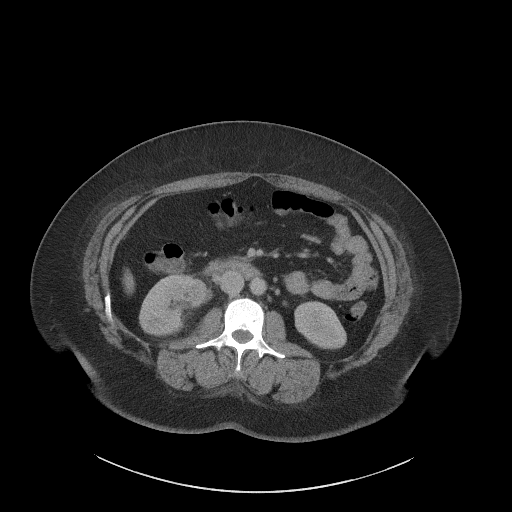
[im 72/114  bone]
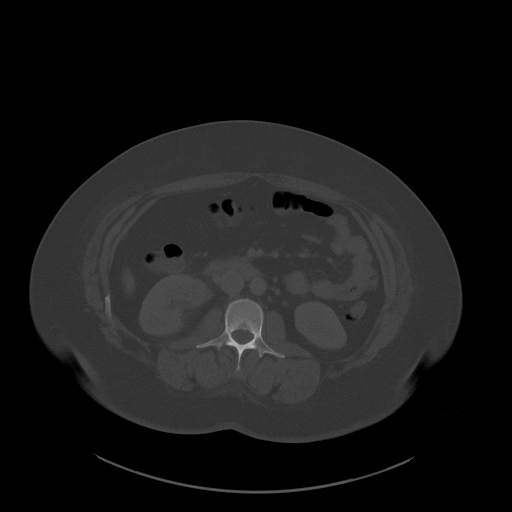
[im 84/114  soft-tissue]
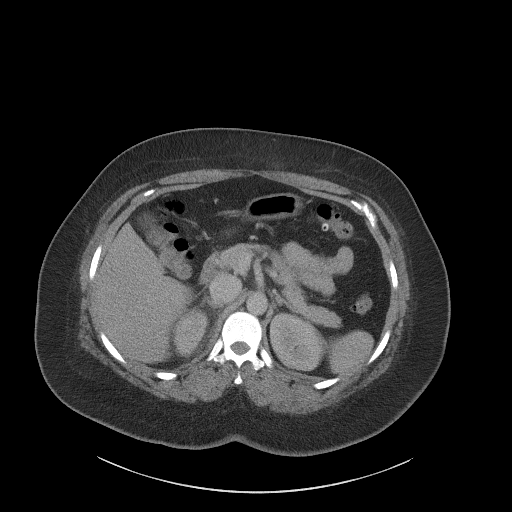
[im 90/114  soft-tissue]
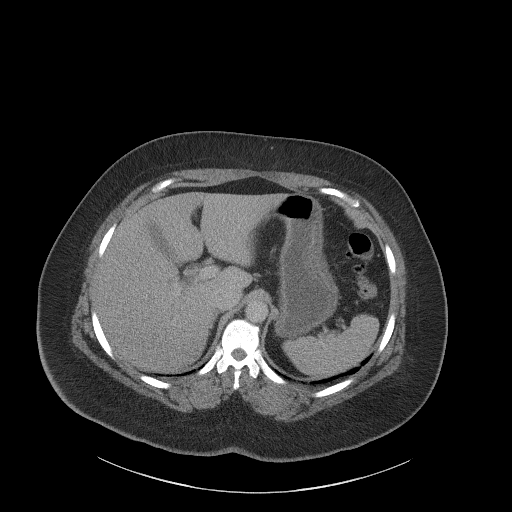
[im 96/114  soft-tissue]
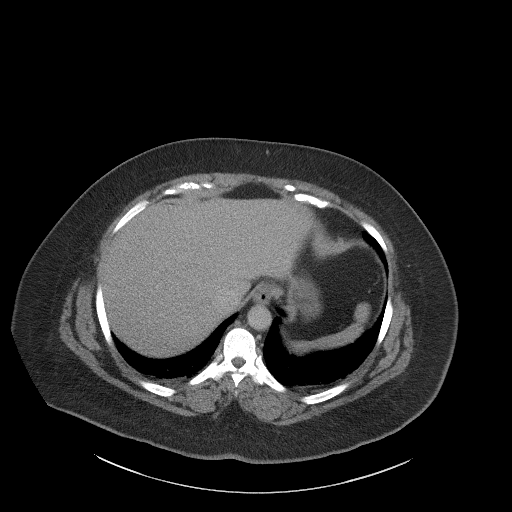
[im 108/114  soft-tissue]
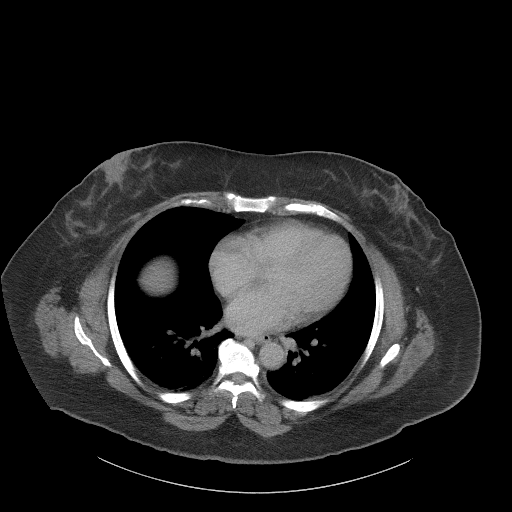

[Series 6: a/p w/ cor · coronal · 0.99mm/px · 3 of 174 slices shown]
[im 58/174  soft-tissue]
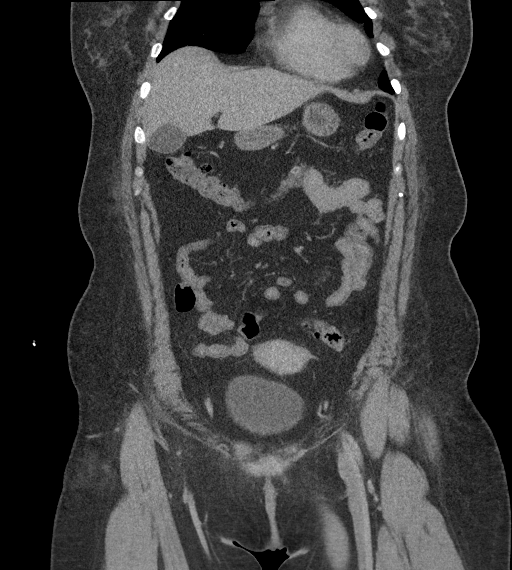
[im 77/174  soft-tissue]
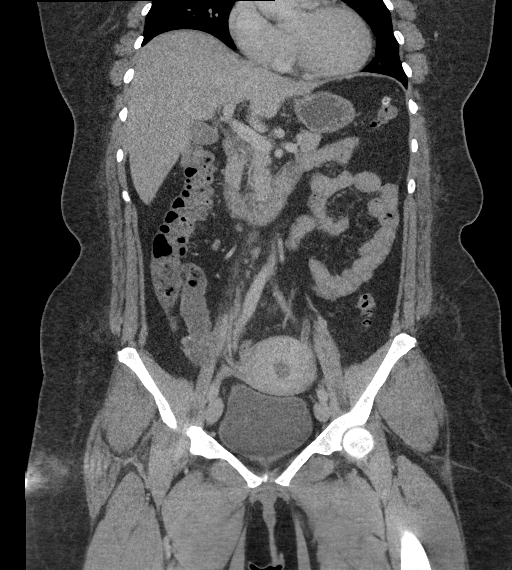
[im 97/174  soft-tissue]
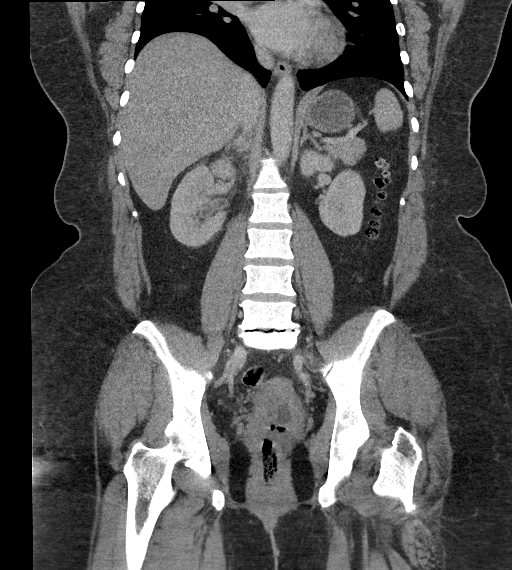

[16 of 46 positions shown; findings below may reference images not displayed]

FINDINGS: Lower chest: No acute abnormality.

Hepatobiliary: No focal liver abnormality is seen. No gallstones,
gallbladder wall thickening, or biliary dilatation.

Pancreas: Unremarkable. No pancreatic ductal dilatation or
surrounding inflammatory changes.

Spleen: Normal in size without focal abnormality.

Adrenals/Urinary Tract: There is fat stranding about the right
kidney with mild right-sided collecting system dilatation. There is
diffuse enhancement of the right ureter. There is no evidence of a
radiopaque obstructing kidney stone. The left kidney is unremarkable
with no evidence of a radiopaque obstructing stone. The bladder is
unremarkable.

Stomach/Bowel: There are scattered colonic diverticula without CT
evidence of diverticulitis. The appendix is unremarkable. The
stomach is unremarkable. There is no evidence of a small-bowel
obstruction.

Vascular/Lymphatic: Aortic atherosclerosis. No enlarged abdominal or
pelvic lymph nodes.

Reproductive: There is a small amount of fluid in the lower uterine
segment. There is an right ovary cystic structure measuring
approximately 3.2 cm.

Other: No abdominal wall hernia or abnormality. No abdominopelvic
ascites.

Musculoskeletal: No acute or significant osseous findings.
IMPRESSION: Overall findings consistent with right-sided pyelonephritis.

## 2022-12-08 ENCOUNTER — Other Ambulatory Visit: Payer: Self-pay

## 2022-12-08 ENCOUNTER — Encounter (HOSPITAL_BASED_OUTPATIENT_CLINIC_OR_DEPARTMENT_OTHER): Payer: Self-pay | Admitting: Emergency Medicine

## 2022-12-08 DIAGNOSIS — G43809 Other migraine, not intractable, without status migrainosus: Secondary | ICD-10-CM | POA: Insufficient documentation

## 2022-12-08 DIAGNOSIS — I10 Essential (primary) hypertension: Secondary | ICD-10-CM | POA: Insufficient documentation

## 2022-12-08 DIAGNOSIS — Z79899 Other long term (current) drug therapy: Secondary | ICD-10-CM | POA: Insufficient documentation

## 2022-12-08 DIAGNOSIS — Z1152 Encounter for screening for COVID-19: Secondary | ICD-10-CM | POA: Insufficient documentation

## 2022-12-08 LAB — RESP PANEL BY RT-PCR (RSV, FLU A&B, COVID)  RVPGX2
Influenza A by PCR: NEGATIVE
Influenza B by PCR: NEGATIVE
Resp Syncytial Virus by PCR: NEGATIVE
SARS Coronavirus 2 by RT PCR: NEGATIVE

## 2022-12-08 NOTE — ED Triage Notes (Signed)
Pt presents to ED POV.pt c/o HTN, HA, and blurred vision x2d. Pt reports that she takes bp meds compliantly.

## 2022-12-09 ENCOUNTER — Emergency Department (HOSPITAL_BASED_OUTPATIENT_CLINIC_OR_DEPARTMENT_OTHER): Payer: Self-pay

## 2022-12-09 ENCOUNTER — Emergency Department (HOSPITAL_BASED_OUTPATIENT_CLINIC_OR_DEPARTMENT_OTHER)
Admission: EM | Admit: 2022-12-09 | Discharge: 2022-12-09 | Disposition: A | Discharge status: Home / Self Care | Payer: Self-pay | Attending: Emergency Medicine | Admitting: Emergency Medicine

## 2022-12-09 DIAGNOSIS — G43809 Other migraine, not intractable, without status migrainosus: Secondary | ICD-10-CM

## 2022-12-09 MED ORDER — PROMETHAZINE HCL 25 MG/ML IJ SOLN: 25.0000 mg | Freq: Four times a day (QID) | INTRAMUSCULAR | Status: DC | PRN

## 2022-12-09 MED ORDER — KETOROLAC TROMETHAMINE 30 MG/ML IJ SOLN
30.0000 mg | Freq: Once | INTRAMUSCULAR | Status: AC
  Administered 2022-12-09: 30 mg via INTRAMUSCULAR
  Filled 2022-12-09: qty 1

## 2022-12-09 NOTE — Discharge Instructions (Signed)
You were seen today for headache.  Your CT scan does not show any change in your cyst.  Make sure that you are getting plenty of rest.

## 2022-12-09 NOTE — ED Provider Notes (Signed)
[facility] EMERGENCY DEPT Provider Note   CSN: [id number] Arrival date & time: 12/08/22  1944     History  Chief Complaint  Patient presents with   Hypertension    Connie Rush is a 46 y.o. female.  HPI     This a 46 year old female who presents with headache.  Patient states she has had 1 to 2-day history of worsening headache.  She does have a history of migraines but states that this feels worse.  She endorses some photophobia.  She reports pain all over her head.  She has had some blurry vision.  No double vision.  Denies recent fevers or neck pain.  She took over-the-counter medication at home with no relief.  She initially felt like this may be related to her blood pressure; however, blood pressures here have been fairly well-controlled and she is in compliance with her medications.  Denies any recent viral symptoms including congestion or cough.  Patient states that she is concerned because she has "a cyst on my brain."  Prior MRI reviewed.  Patient has chronic neurocysticercosis.  She had a frontal lobe cyst noted on prior MRI.  Home Medications Prior to Admission medications   Medication Sig Start Date End Date Taking? Authorizing Provider  amoxicillin-clavulanate (AUGMENTIN) 875-125 MG tablet Take 1 tablet by mouth 2 (two) times daily. 06/11/19   Alwyn Ren, MD  lisinopril (ZESTRIL) 40 MG tablet Take 1 tablet (40 mg total) by mouth daily. 06/11/19   Alwyn Ren, MD      Allergies    Patient has no known allergies.    Review of Systems   Review of Systems  Constitutional:  Negative for fever.  Eyes:  Positive for photophobia.  Respiratory:  Negative for shortness of breath.   Cardiovascular:  Negative for chest pain.  Neurological:  Positive for headaches. Negative for dizziness.  All other systems reviewed and are negative.   Physical Exam Updated Vital Signs BP (!) 142/75   Pulse 63   Temp 99.4 F (37.4 C) (Oral)   Resp 19    SpO2 100%  Physical Exam Vitals and nursing note reviewed.  Constitutional:      Appearance: She is well-developed. She is obese. She is not ill-appearing.  HENT:     Head: Normocephalic and atraumatic.     Mouth/Throat:     Mouth: Mucous membranes are moist.  Eyes:     Extraocular Movements: Extraocular movements intact.     Pupils: Pupils are equal, round, and reactive to light.  Cardiovascular:     Rate and Rhythm: Normal rate and regular rhythm.     Heart sounds: Normal heart sounds.  Pulmonary:     Effort: Pulmonary effort is normal. No respiratory distress.     Breath sounds: No wheezing.  Abdominal:     Palpations: Abdomen is soft.  Musculoskeletal:     Cervical back: Neck supple.  Skin:    General: Skin is warm and dry.  Neurological:     Mental Status: She is alert and oriented to person, place, and time.     Comments: Cranial nerves II through XII intact, 5 out of 5 strength in all 4 extremities, no dysmetria to finger-nose-finger.  Psychiatric:        Mood and Affect: Mood normal.     ED Results / Procedures / Treatments   Labs (all labs ordered are listed, but only abnormal results are displayed) Labs Reviewed  RESP PANEL BY RT-PCR (RSV, FLU  A&B, COVID)  [id number]    EKG EKG Interpretation  Date/Time:  Friday December 08 2022 20:05:48 EST Ventricular Rate:  86 PR Interval:  144 QRS Duration: 80 QT Interval:  374 QTC Calculation: 447 R Axis:   101 Text Interpretation: Normal sinus rhythm Rightward axis Cannot rule out Anterior infarct , age undetermined Abnormal ECG When compared with ECG of 11-Jun-2019 01:14, Nonspecific T wave abnormality no longer evident in Inferior leads Nonspecific T wave abnormality no longer evident in Anterolateral leads Confirmed by Ross Marcus ([id number]) on 12/09/2022 2:22:17 AM  Radiology CT Head Wo Contrast  Result Date: 12/09/2022 CLINICAL DATA:  Headache EXAM: CT HEAD WITHOUT CONTRAST TECHNIQUE: Contiguous axial  images were obtained from the base of the skull through the vertex without intravenous contrast. RADIATION DOSE REDUCTION: This exam was performed according to the departmental dose-optimization program which includes automated exposure control, adjustment of the mA and/or kV according to patient size and/or use of iterative reconstruction technique. COMPARISON:  Brain MRI 06/10/2019 FINDINGS: Brain: Unchanged size of fluid-filled cavity at the anterior left convexity. There is hyperdensity at the anterior aspect of this region that corresponds to signal loss on susceptibility weighted imaging on the prior MRI. There is no acute hemorrhage. Multifocal parenchymal calcification. Vascular: No abnormal hyperdensity of the major intracranial arteries or dural venous sinuses. No intracranial atherosclerosis. Skull: The visualized skull base, calvarium and extracranial soft tissues are normal. Sinuses/Orbits: No fluid levels or advanced mucosal thickening of the visualized paranasal sinuses. No mastoid or middle ear effusion. The orbits are normal. IMPRESSION: 1. No acute intracranial abnormality. 2. Sequelae of neurocysticercosis including chronic cystic left frontal lesion. Electronically Signed   By: Deatra Robinson M.D.   On: 12/09/2022 03:38    Procedures Procedures    Medications Ordered in ED Medications  promethazine (PHENERGAN) injection 25 mg (has no administration in time range)  ketorolac (TORADOL) 30 MG/ML injection 30 mg (30 mg Intramuscular Given 12/09/22 0245)    ED Course/ Medical Decision Making/ A&P Clinical Course as of 12/09/22 0450  Sat Dec 09, 2022  0440 On recheck, patient states she feels better.  She is ready to be discharged home. [CH]    Clinical Course User Index [CH] Kathan Kirker, Mayer Masker, MD                           Medical Decision Making Amount and/or Complexity of Data Reviewed Radiology: ordered.  Risk Prescription drug management.   This patient presents to the  ED for concern of headache, this involves an extensive number of treatment options, and is a complaint that carries with it a high risk of complications and morbidity.  I considered the following differential and admission for this acute, potentially life threatening condition.  The differential diagnosis includes migraine, tension headache, headache related to acute viral illness, less likely meningitis or subarachnoid hemorrhage  MDM:    This is a 46 year old female who presents primarily with headache.  She is nontoxic.  Vital signs notable for temperature of 100.  Blood pressure 142-145/75-90.  She has no red flags.  She is neurologically intact.  She does have a history of migraines but states that this is somewhat worse and is concerned about her cyst.  CT scan shows stable cyst without hemorrhage.  Doubt subarachnoid hemorrhage or meningitis.  Patient was given IM Toradol and Phenergan.  On recheck, she states she feels much better.  (Labs, imaging, consults)  Labs: I Ordered,  and personally interpreted labs.  The pertinent results include: COVID, influenza negative  Imaging Studies ordered: I ordered imaging studies including CT head I independently visualized and interpreted imaging. I agree with the radiologist interpretation  Additional history obtained from chart review.  External records from outside source obtained and reviewed including prior evaluations   cardiac Monitoring: The patient was maintained on a cardiac monitor.  I personally viewed and interpreted the cardiac monitored which showed an underlying rhythm of: Sinus rhythm  Reevaluation: After the interventions noted above, I reevaluated the patient and found that they have :improved  Social Determinants of Health:  lives independently  Disposition: Discharge  Co morbidities that complicate the patient evaluation  Past Medical History:  Diagnosis Date   Hypertension    Migraine      Medicines Meds ordered  this encounter  Medications   ketorolac (TORADOL) 30 MG/ML injection 30 mg   promethazine (PHENERGAN) injection 25 mg    I have reviewed the patients home medicines and have made adjustments as needed  Problem List / ED Course: Problem List Items Addressed This Visit   None Visit Diagnoses     Other migraine without status migrainosus, not intractable    -  Primary   Relevant Medications   ketorolac (TORADOL) 30 MG/ML injection 30 mg (Completed)                   Final Clinical Impression(s) / ED Diagnoses Final diagnoses:  Other migraine without status migrainosus, not intractable    Rx / DC Orders ED Discharge Orders     None         Jonnie Truxillo, Mayer Masker, MD 12/09/22 0500

## 2024-02-05 LAB — HM MAMMOGRAPHY

## 2025-01-20 ENCOUNTER — Encounter: Payer: Self-pay | Admitting: Sports Medicine

## 2025-01-20 ENCOUNTER — Ambulatory Visit: Payer: Self-pay | Admitting: Sports Medicine

## 2025-01-20 VITALS — BP 140/90 | HR 69 | Temp 98.0°F | Ht 66.0 in | Wt 284.8 lb

## 2025-01-20 DIAGNOSIS — R42 Dizziness and giddiness: Secondary | ICD-10-CM

## 2025-01-20 DIAGNOSIS — H538 Other visual disturbances: Secondary | ICD-10-CM | POA: Diagnosis not present

## 2025-01-20 DIAGNOSIS — I10 Essential (primary) hypertension: Secondary | ICD-10-CM | POA: Diagnosis not present

## 2025-01-20 DIAGNOSIS — Z113 Encounter for screening for infections with a predominantly sexual mode of transmission: Secondary | ICD-10-CM

## 2025-01-20 DIAGNOSIS — Z124 Encounter for screening for malignant neoplasm of cervix: Secondary | ICD-10-CM

## 2025-01-20 DIAGNOSIS — Z13220 Encounter for screening for lipoid disorders: Secondary | ICD-10-CM

## 2025-01-20 DIAGNOSIS — Z131 Encounter for screening for diabetes mellitus: Secondary | ICD-10-CM

## 2025-01-20 DIAGNOSIS — Z1329 Encounter for screening for other suspected endocrine disorder: Secondary | ICD-10-CM

## 2025-01-20 DIAGNOSIS — K5900 Constipation, unspecified: Secondary | ICD-10-CM | POA: Diagnosis not present

## 2025-01-20 DIAGNOSIS — Z9189 Other specified personal risk factors, not elsewhere classified: Secondary | ICD-10-CM

## 2025-01-20 DIAGNOSIS — R0602 Shortness of breath: Secondary | ICD-10-CM | POA: Diagnosis not present

## 2025-01-20 DIAGNOSIS — G43809 Other migraine, not intractable, without status migrainosus: Secondary | ICD-10-CM

## 2025-01-20 DIAGNOSIS — K219 Gastro-esophageal reflux disease without esophagitis: Secondary | ICD-10-CM

## 2025-01-20 DIAGNOSIS — Z1211 Encounter for screening for malignant neoplasm of colon: Secondary | ICD-10-CM

## 2025-01-20 MED ORDER — DOCUSATE SODIUM 100 MG PO CAPS: 100.0000 mg | ORAL_CAPSULE | Freq: Every day | ORAL | 0 refills | Status: AC

## 2025-01-20 MED ORDER — LISINOPRIL 40 MG PO TABS: 40.0000 mg | ORAL_TABLET | Freq: Every day | ORAL | 0 refills | Status: AC

## 2025-01-20 MED ORDER — PANTOPRAZOLE SODIUM 40 MG PO TBEC: 40.0000 mg | DELAYED_RELEASE_TABLET | Freq: Every day | ORAL | 0 refills | Status: AC

## 2025-01-20 MED ORDER — HYDROCHLOROTHIAZIDE 25 MG PO TABS: 25.0000 mg | ORAL_TABLET | Freq: Every day | ORAL | 0 refills | Status: AC

## 2025-01-20 MED ORDER — MECLIZINE HCL 12.5 MG PO TABS: 12.5000 mg | ORAL_TABLET | Freq: Every day | ORAL | 0 refills | Status: AC | PRN

## 2025-01-20 NOTE — Progress Notes (Addendum)
 "  New Patient Office Visit  Patient ID: Connie Rush, Female   DOB: 1976/03/19 49 y.o. MRN: [id number] Subjective:     Discussed the use of AI scribe software for clinical note transcription with the patient, who gave verbal consent to proceed.  History of Present Illness   [name] is a 49 year old female with hypertension who presents for establishment of care and management of high blood pressure.  She has been experiencing elevated blood pressure readings at home, typically ranging between 125-145 mmHg, with occasional readings above 140 mmHg. She takes a combination pill of lisinopril  and hydrochlorothiazide  daily but ran out of medication two days ago, which may have contributed to her current elevated reading of 150 mmHg in the clinic. No recent changes in diet, and she does not consume salty or fried foods. She does not exercise regularly.  She has a history of a brain cyst diagnosed in 2018, identified during a hospital visit for a migraine. No seizures, but she experiences migraines twice a month, often triggered by weather changes, managed with ibuprofen and resting in a dark room.  CT 2018  Brain: No evidence of acute large vascular territory infarct.  Redemonstrated sequelae of neurocysticercosis with a cystic lesion along the left frontal convexity measuring 2.5 x 1.8 cm, similar across modalities given differences in technique. There are several calcifications along the surface of the brain including in the right cerebral peduncle, left frontal operculum, and along the bilateral frontal convexities consistent with the nodular calcified stage of neurocysticercosis. There is no new brain parenchymal edema or acute hemorrhage. No hydrocephalus.        As seen on prior MRI there is an empty sella and prominent Meckel's cave right greater than left, findings which can be associated with dural ectasia or chronically elevated intracranial pressure.   She experiences persistent  dizziness described as a 'room spinning' sensation, occurring both when sitting and standing, ongoing for many years. No lightheadedness or feeling like she will pass out.  She occasionally feels nauseous and experiences acid reflux symptoms, particularly when eating late at night. No frequent heartburn or acid reflux issues.  She reports shortness of breath sometimes. She sleeps with her head slightly elevated and denies any swelling in her legs. No history of heart problems and has never had an echocardiogram.  She has bowel movements once a day, sometimes experiencing hard stools. She drinks three to four glasses of water a day.  She snores heavily, and her husband has noted that she stops breathing during sleep. She has not had a sleep study before. She wakes up with headaches.  She has been experiencing blurry vision for about a year, worsening with prolonged phone use. No complete loss of vision.  She reports irregular periods, with the last one occurring in March of the previous year.  Outpatient Encounter Medications as of 01/20/2025  Medication Sig   docusate sodium  (COLACE) 100 MG capsule Take 1 capsule (100 mg total) by mouth daily.   hydrochlorothiazide  (HYDRODIURIL ) 25 MG tablet Take 1 tablet (25 mg total) by mouth daily.   lisinopril  (ZESTRIL ) 40 MG tablet Take 1 tablet (40 mg total) by mouth daily.   meclizine  (ANTIVERT ) 12.5 MG tablet Take 1 tablet (12.5 mg total) by mouth daily as needed for dizziness.   pantoprazole  (PROTONIX ) 40 MG tablet Take 1 tablet (40 mg total) by mouth daily.   [DISCONTINUED] lisinopril -hydrochlorothiazide  (ZESTORETIC) 20-25 MG tablet Take 1 tablet by mouth daily.   [DISCONTINUED] amoxicillin -clavulanate (  AUGMENTIN ) 875-125 MG tablet Take 1 tablet by mouth 2 (two) times daily. (Patient not taking: Reported on 01/20/2025)   [DISCONTINUED] lisinopril  (ZESTRIL ) 40 MG tablet Take 1 tablet (40 mg total) by mouth daily. (Patient not taking: Reported on  01/20/2025)   No facility-administered encounter medications on file as of 01/20/2025.    Past Medical History:  Diagnosis Date   Hypertension    Migraine     Past Surgical History:  Procedure Laterality Date   TUBAL LIGATION      Family History  Problem Relation Age of Onset   Diabetes Father    Hyperlipidemia Father    Hypertension Father    CAD Maternal Grandmother    Stroke Neg Hx    Breast cancer Neg Hx     Social History   Socioeconomic History   Marital status: Married    Spouse name: Not on file   Number of children: Not on file   Years of education: Not on file   Highest education level: Not on file  Occupational History   Not on file  Tobacco Use   Smoking status: Never   Smokeless tobacco: Never  Substance and Sexual Activity   Alcohol use: Never   Drug use: Never   Sexual activity: Yes  Other Topics Concern   Not on file  Social History Narrative   Not on file   Social Drivers of Health   Tobacco Use: Low Risk (01/20/2025)   Patient History    Smoking Tobacco Use: Never    Smokeless Tobacco Use: Never    Passive Exposure: Not on file  Financial Resource Strain: Not on file  Food Insecurity: Not on file  Transportation Needs: Not on file  Physical Activity: Not on file  Stress: Not on file  Social Connections: Not on file  Intimate Partner Violence: Not on file  Depression (PHQ2-9): Low Risk (01/20/2025)   Depression (PHQ2-9)    PHQ-2 Score: 0  Alcohol Screen: Not on file  Housing: Not on file  Utilities: Not on file  Health Literacy: Not on file    Review of Systems  Constitutional:  Negative for chills and fever.  HENT:  Negative for congestion and sore throat.   Eyes:  Positive for blurred vision. Negative for double vision.  Respiratory:  Negative for cough and sputum production. Shortness of breath: exertional.  Cardiovascular:  Negative for chest pain, palpitations and leg swelling.  Gastrointestinal:  Positive for  constipation. Negative for abdominal pain, heartburn and nausea.  Genitourinary:  Negative for dysuria, frequency and hematuria.  Musculoskeletal:  Negative for falls and myalgias.  Neurological:  Negative for dizziness, sensory change and focal weakness.       Vertigo  Psychiatric/Behavioral:  Negative for depression. The patient is not nervous/anxious.      Objective:    BP (!) 140/90   Pulse 69   Temp 98 F (36.7 C) (Oral)   Ht 5' 6 (1.676 m)   Wt 284 lb 12.8 oz (129.2 kg)   SpO2 97%   BMI 45.97 kg/m   Physical Exam Constitutional:      Appearance: Normal appearance.  HENT:     Head: Normocephalic and atraumatic.     Right Ear: Tympanic membrane normal.     Left Ear: Tympanic membrane normal.  Cardiovascular:     Rate and Rhythm: Normal rate and regular rhythm.  Pulmonary:     Effort: Pulmonary effort is normal. No respiratory distress.     Breath sounds:  Normal breath sounds. No wheezing.  Abdominal:     General: Bowel sounds are normal. There is no distension.     Tenderness: There is no abdominal tenderness. There is no guarding or rebound.     Comments:    Musculoskeletal:        General: No swelling or tenderness.  Neurological:     Mental Status: She is alert. Mental status is at baseline.     Sensory: No sensory deficit.     Motor: No weakness.     Comments: Gait normal EOMI Strength and sensations intact  Finger nose neg      Last CBC Lab Results  Component Value Date   WBC 13.3 (H) 06/11/2019   HGB 9.9 (L) 06/11/2019   HCT 30.3 (L) 06/11/2019   MCV 75.2 (L) 06/11/2019   MCH 24.6 (L) 06/11/2019   RDW 17.3 (H) 06/11/2019   PLT 178 06/11/2019   Last metabolic panel Lab Results  Component Value Date   GLUCOSE 129 (H) 06/11/2019   NA 138 06/11/2019   K 3.1 (L) 06/11/2019   CL 105 06/11/2019   CO2 23 06/11/2019   BUN 6 06/11/2019   CREATININE 0.97 06/11/2019   GFRNONAA >60 06/11/2019   CALCIUM 8.5 (L) 06/11/2019   PHOS 2.6 06/11/2019    PROT 6.4 (L) 06/09/2019   ALBUMIN 3.2 (L) 06/11/2019   BILITOT 0.5 06/09/2019   ALKPHOS 96 06/09/2019   AST 28 06/09/2019   ALT 50 (H) 06/09/2019   ANIONGAP 10 06/11/2019   Last lipids No results found for: CHOL, HDL, LDLCALC, LDLDIRECT, TRIG, CHOLHDL Last hemoglobin A1c No results found for: HGBA1C Last thyroid functions No results found for: TSH, T3TOTAL, T4TOTAL, FREET4, THYROIDAB Last vitamin D No results found for: 25OHVITD2, 25OHVITD3, VD25OH Last vitamin B12 and Folate No results found for: VITAMINB12, FOLATE     Assessment & Plan:   Assessment & Plan Primary hypertension Bp high  Will increase lisinopril  to 40 mg  Monitor bp daily and keep a log Avoid salty foods Exercise regularly  Follow up in 4 weeks Orders:   CBC with Differential/Platelet; Future   COMPLETE METABOLIC PANEL WITHOUT GFR; Future   lisinopril  (ZESTRIL ) 40 MG tablet; Take 1 tablet (40 mg total) by mouth daily.   hydrochlorothiazide  (HYDRODIURIL ) 25 MG tablet; Take 1 tablet (25 mg total) by mouth daily.  Vertigo Will refer to vestibular therapy Neuro exam unremarkable Orders:   Ambulatory referral to Physical Therapy   meclizine  (ANTIVERT ) 12.5 MG tablet; Take 1 tablet (12.5 mg total) by mouth daily as needed for dizziness.  Other migraine without status migrainosus, not intractable Stable 2 episodes/ month  Take tylenol  or nsaids prn    Gastroesophageal reflux disease, unspecified whether esophagitis present C/o nausea Denies bloody or dark stool Orders:   CBC with Differential/Platelet; Future   pantoprazole  (PROTONIX ) 40 MG tablet; Take 1 tablet (40 mg total) by mouth daily.  SOB (shortness of breath) on exertion Chronic  Reports orthopnea No lower extremity swelling Will order ECHO Orders:   CBC with Differential/Platelet; Future   ECHOCARDIOGRAM COMPLETE; Future  Constipation, unspecified constipation type Increase oral hydration and fiber  intake Orders:   docusate sodium  (COLACE) 100 MG capsule; Take 1 capsule (100 mg total) by mouth daily.  At risk for sleep apnea Snoring ,witnessed apnea+ Will refer to sleep study Orders:   Ambulatory referral to Sleep Studies  Screening for diabetes mellitus  Orders:   Hemoglobin A1c; Future  Screening for lipid disorders  Orders:   Lipid panel; Future  Screening for thyroid disorder  Orders:   TSH; Future  Screening for cervical cancer  Orders:   Ambulatory referral to Obstetrics / Gynecology  Blurring of vision Chronic since  yr Denies double vision Orders:   Ambulatory referral to Ophthalmology  Screening for colon cancer  Orders:   Ambulatory referral to Gastroenterology  Screening for STD (sexually transmitted disease)  Orders:   Hepatitis C Antibody; Future   Return in about 4 weeks (around 02/17/2025) for blood pressure f/u.   Jackalyn Blazing, MD [facility]    "

## 2025-01-20 NOTE — Patient Instructions (Signed)

## 2025-01-20 NOTE — Assessment & Plan Note (Signed)
 Will refer to vestibular therapy Neuro exam unremarkable Orders:   Ambulatory referral to Physical Therapy   meclizine  (ANTIVERT ) 12.5 MG tablet; Take 1 tablet (12.5 mg total) by mouth daily as needed for dizziness.

## 2025-01-21 ENCOUNTER — Other Ambulatory Visit (INDEPENDENT_AMBULATORY_CARE_PROVIDER_SITE_OTHER)

## 2025-01-21 DIAGNOSIS — I10 Essential (primary) hypertension: Secondary | ICD-10-CM | POA: Diagnosis not present

## 2025-01-21 DIAGNOSIS — Z131 Encounter for screening for diabetes mellitus: Secondary | ICD-10-CM

## 2025-01-21 DIAGNOSIS — R0602 Shortness of breath: Secondary | ICD-10-CM | POA: Diagnosis not present

## 2025-01-21 DIAGNOSIS — K219 Gastro-esophageal reflux disease without esophagitis: Secondary | ICD-10-CM | POA: Diagnosis not present

## 2025-01-21 DIAGNOSIS — Z113 Encounter for screening for infections with a predominantly sexual mode of transmission: Secondary | ICD-10-CM

## 2025-01-21 DIAGNOSIS — Z1329 Encounter for screening for other suspected endocrine disorder: Secondary | ICD-10-CM

## 2025-01-21 DIAGNOSIS — Z13220 Encounter for screening for lipoid disorders: Secondary | ICD-10-CM | POA: Diagnosis not present

## 2025-01-21 LAB — CBC WITH DIFFERENTIAL/PLATELET
Basophils Absolute: 0.1 10*3/uL (ref 0.0–0.1)
Basophils Relative: 1 % (ref 0.0–3.0)
Eosinophils Absolute: 0.3 10*3/uL (ref 0.0–0.7)
Eosinophils Relative: 3.9 % (ref 0.0–5.0)
HCT: 43.2 % (ref 36.0–46.0)
Hemoglobin: 14.4 g/dL (ref 12.0–15.0)
Lymphocytes Relative: 34 % (ref 12.0–46.0)
Lymphs Abs: 2.4 10*3/uL (ref 0.7–4.0)
MCHC: 33.3 g/dL (ref 30.0–36.0)
MCV: 89 fl (ref 78.0–100.0)
Monocytes Absolute: 0.6 10*3/uL (ref 0.1–1.0)
Monocytes Relative: 9.1 % (ref 3.0–12.0)
Neutro Abs: 3.6 10*3/uL (ref 1.4–7.7)
Neutrophils Relative %: 52 % (ref 43.0–77.0)
Platelets: 226 10*3/uL (ref 150.0–400.0)
RBC: 4.85 Mil/uL (ref 3.87–5.11)
RDW: 14.6 % (ref 11.5–15.5)
WBC: 7 10*3/uL (ref 4.0–10.5)

## 2025-01-21 LAB — LIPID PANEL
Cholesterol: 254 mg/dL — ABNORMAL HIGH (ref 28–200)
HDL: 60.7 mg/dL
LDL Cholesterol: 164 mg/dL — ABNORMAL HIGH (ref 10–99)
NonHDL: 193.72
Total CHOL/HDL Ratio: 4
Triglycerides: 148 mg/dL (ref 10.0–149.0)
VLDL: 29.6 mg/dL (ref 0.0–40.0)

## 2025-01-21 LAB — HEMOGLOBIN A1C: Hgb A1c MFr Bld: 5.9 % (ref 4.6–6.5)

## 2025-01-22 ENCOUNTER — Ambulatory Visit: Payer: Self-pay | Admitting: Sports Medicine

## 2025-01-22 ENCOUNTER — Other Ambulatory Visit

## 2025-01-22 LAB — COMPLETE METABOLIC PANEL WITHOUT GFR
AG Ratio: 1.5 (calc) (ref 1.0–2.5)
ALT: 41 U/L — ABNORMAL HIGH (ref 6–29)
AST: 29 U/L (ref 10–35)
Albumin: 4.4 g/dL (ref 3.6–5.1)
Alkaline phosphatase (APISO): 125 U/L (ref 31–125)
BUN: 16 mg/dL (ref 7–25)
CO2: 27 mmol/L (ref 20–32)
Calcium: 9.1 mg/dL (ref 8.6–10.2)
Chloride: 106 mmol/L (ref 98–110)
Creat: 0.98 mg/dL (ref 0.50–0.99)
Globulin: 2.9 g/dL (ref 1.9–3.7)
Glucose, Bld: 82 mg/dL (ref 65–99)
Potassium: 4.1 mmol/L (ref 3.5–5.3)
Sodium: 142 mmol/L (ref 135–146)
Total Bilirubin: 0.6 mg/dL (ref 0.2–1.2)
Total Protein: 7.3 g/dL (ref 6.1–8.1)

## 2025-01-22 LAB — HEPATITIS C ANTIBODY: Hepatitis C Ab: NONREACTIVE

## 2025-01-22 LAB — TSH: TSH: 3.38 u[IU]/mL (ref 0.35–5.50)

## 2025-01-22 NOTE — Telephone Encounter (Signed)
 The 10-year ASCVD risk score (Arnett DK, et al., 2019) is: 2.1%   Values used to calculate the score:     Age: 48 years     Clinically relevant sex: Female     Is Non-Hispanic African American: No     Diabetic: No     Tobacco smoker: No     Systolic Blood Pressure: 140 mmHg     Is BP treated: Yes     HDL Cholesterol: 60.7 mg/dL     Total Cholesterol: 254 mg/dL

## 2025-02-16 ENCOUNTER — Ambulatory Visit (HOSPITAL_BASED_OUTPATIENT_CLINIC_OR_DEPARTMENT_OTHER)

## 2025-02-18 ENCOUNTER — Ambulatory Visit: Admitting: Sports Medicine
